# Patient Record
Sex: Female | Born: 1985 | Race: Black or African American | Hispanic: No | Marital: Single | Smoking: Never smoker
Health system: Southern US, Community
[De-identification: ages and names within clinical notes are randomized; demographics above are authoritative.]

## PROBLEM LIST (undated history)

## (undated) DIAGNOSIS — E119 Type 2 diabetes mellitus without complications: Secondary | ICD-10-CM

## (undated) DIAGNOSIS — E049 Nontoxic goiter, unspecified: Secondary | ICD-10-CM

## (undated) DIAGNOSIS — I10 Essential (primary) hypertension: Secondary | ICD-10-CM

## (undated) HISTORY — DX: Type 2 diabetes mellitus without complications: E11.9

## (undated) HISTORY — PX: FRACTURE SURGERY: SHX138

## (undated) HISTORY — DX: Essential (primary) hypertension: I10

## (undated) HISTORY — DX: Nontoxic goiter, unspecified: E04.9

---
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
## 2005-02-10 ENCOUNTER — Emergency Department: Payer: Self-pay | Admitting: Unknown Physician Specialty

## 2008-06-11 ENCOUNTER — Ambulatory Visit: Payer: Self-pay | Admitting: Obstetrics and Gynecology

## 2008-06-12 ENCOUNTER — Inpatient Hospital Stay: Payer: Self-pay | Admitting: Obstetrics and Gynecology

## 2012-12-15 ENCOUNTER — Ambulatory Visit: Payer: Self-pay | Admitting: Physician Assistant

## 2012-12-15 LAB — PREGNANCY, URINE: Pregnancy Test, Urine: NEGATIVE m[IU]/mL

## 2012-12-21 ENCOUNTER — Ambulatory Visit: Payer: Self-pay | Admitting: Unknown Physician Specialty

## 2016-11-14 ENCOUNTER — Emergency Department: Payer: Self-pay

## 2016-11-14 ENCOUNTER — Emergency Department
Admission: EM | Admit: 2016-11-14 | Discharge: 2016-11-14 | Disposition: A | Discharge status: Home / Self Care | Payer: Self-pay | Attending: Emergency Medicine | Admitting: Emergency Medicine

## 2016-11-14 DIAGNOSIS — E1165 Type 2 diabetes mellitus with hyperglycemia: Secondary | ICD-10-CM

## 2016-11-14 DIAGNOSIS — I10 Essential (primary) hypertension: Secondary | ICD-10-CM

## 2016-11-14 DIAGNOSIS — Z5181 Encounter for therapeutic drug level monitoring: Secondary | ICD-10-CM | POA: Insufficient documentation

## 2016-11-14 DIAGNOSIS — R739 Hyperglycemia, unspecified: Secondary | ICD-10-CM

## 2016-11-14 DIAGNOSIS — E119 Type 2 diabetes mellitus without complications: Secondary | ICD-10-CM

## 2016-11-14 DIAGNOSIS — E876 Hypokalemia: Secondary | ICD-10-CM

## 2016-11-14 LAB — COMPREHENSIVE METABOLIC PANEL
ALT: 12 U/L — ABNORMAL LOW (ref 14–54)
AST: 20 U/L (ref 15–41)
Albumin: 3.9 g/dL (ref 3.5–5.0)
Alkaline Phosphatase: 127 U/L — ABNORMAL HIGH (ref 38–126)
Anion gap: 12 (ref 5–15)
BUN: 7 mg/dL (ref 6–20)
CO2: 23 mmol/L (ref 22–32)
Calcium: 8.8 mg/dL — ABNORMAL LOW (ref 8.9–10.3)
Chloride: 97 mmol/L — ABNORMAL LOW (ref 101–111)
Creatinine, Ser: 0.53 mg/dL (ref 0.44–1.00)
GFR calc Af Amer: >60 mL/min (ref >60)
GFR calc non Af Amer: >60 mL/min (ref >60)
Glucose, Bld: 381 mg/dL — ABNORMAL HIGH (ref 65–99)
POTASSIUM: 3.1 mmol/L — AB (ref 3.5–5.1)
Sodium: 132 mmol/L — ABNORMAL LOW (ref 135–145)
TOTAL PROTEIN: 7.9 g/dL (ref 6.5–8.1)
Total Bilirubin: 0.5 mg/dL (ref 0.3–1.2)

## 2016-11-14 LAB — CBC
HEMATOCRIT: 39.3 % (ref 35.0–47.0)
Hemoglobin: 13.4 g/dL (ref 12.0–16.0)
MCH: 29.5 pg (ref 26.0–34.0)
MCHC: 34.2 g/dL (ref 32.0–36.0)
MCV: 86.2 fL (ref 80.0–100.0)
Platelets: 330 10*3/uL (ref 150–440)
RBC: 4.56 MIL/uL (ref 3.80–5.20)
RDW: 13.1 % (ref 11.5–14.5)
WBC: 9.8 10*3/uL (ref 3.6–11.0)

## 2016-11-14 LAB — URINE DRUG SCREEN, QUALITATIVE (ARMC ONLY)
AMPHETAMINES, UR SCREEN: NOT DETECTED
Barbiturates, Ur Screen: NOT DETECTED
Benzodiazepine, Ur Scrn: NOT DETECTED
COCAINE METABOLITE, UR ~~LOC~~: NOT DETECTED
Cannabinoid 50 Ng, Ur ~~LOC~~: NOT DETECTED
MDMA (Ecstasy)Ur Screen: NOT DETECTED
Methadone Scn, Ur: NOT DETECTED
OPIATE, UR SCREEN: NOT DETECTED
PHENCYCLIDINE (PCP) UR S: NOT DETECTED
Tricyclic, Ur Screen: NOT DETECTED

## 2016-11-14 LAB — URINALYSIS, COMPLETE (UACMP) WITH MICROSCOPIC
BACTERIA UA: NONE SEEN
Bilirubin Urine: NEGATIVE
Glucose, UA: >=500 mg/dL — AB
Ketones, ur: 20 mg/dL — AB
Leukocytes, UA: NEGATIVE
Nitrite: NEGATIVE
PROTEIN: NEGATIVE mg/dL
Specific Gravity, Urine: 1.016 (ref 1.005–1.030)
pH: 8 (ref 5.0–8.0)

## 2016-11-14 LAB — INFLUENZA PANEL BY PCR (TYPE A & B)
INFLAPCR: NEGATIVE
Influenza B By PCR: NEGATIVE

## 2016-11-14 LAB — TSH: TSH: 2.472 u[IU]/mL (ref 0.350–4.500)

## 2016-11-14 LAB — GLUCOSE, CAPILLARY
GLUCOSE-CAPILLARY: 307 mg/dL — AB (ref 65–99)
Glucose-Capillary: 272 mg/dL — ABNORMAL HIGH (ref 65–99)

## 2016-11-14 LAB — T4, FREE: FREE T4: 0.96 ng/dL (ref 0.61–1.12)

## 2016-11-14 LAB — CK: Total CK: 102 U/L (ref 38–234)

## 2016-11-14 LAB — TROPONIN I: Troponin I: <0.03 ng/mL (ref <0.03)

## 2016-11-14 MED ORDER — INSULIN ASPART 100 UNIT/ML ~~LOC~~ SOLN: 0.0000 [IU] | Freq: Three times a day (TID) | SUBCUTANEOUS | Status: DC

## 2016-11-14 MED ORDER — IOPAMIDOL (ISOVUE-370) INJECTION 76%
75.0000 mL | Freq: Once | INTRAVENOUS | Status: AC | PRN
  Administered 2016-11-14: 75 mL via INTRAVENOUS

## 2016-11-14 MED ORDER — SODIUM CHLORIDE 0.9 % IV BOLUS (SEPSIS)
1000.0000 mL | Freq: Once | INTRAVENOUS | Status: AC
  Administered 2016-11-14: 1000 mL via INTRAVENOUS

## 2016-11-14 MED ORDER — HYDRALAZINE HCL 20 MG/ML IJ SOLN: 10.0000 mg | Freq: Once | INTRAMUSCULAR | Status: DC

## 2016-11-14 MED ORDER — AMLODIPINE BESYLATE 5 MG PO TABS
10.0000 mg | ORAL_TABLET | Freq: Once | ORAL | Status: AC
  Administered 2016-11-14: 10 mg via ORAL
  Filled 2016-11-14: qty 2

## 2016-11-14 MED ORDER — AMLODIPINE BESYLATE 10 MG PO TABS: 10.0000 mg | ORAL_TABLET | Freq: Every day | ORAL | 0 refills | Status: DC

## 2016-11-14 MED ORDER — METFORMIN HCL 500 MG PO TABS: 500.0000 mg | ORAL_TABLET | Freq: Two times a day (BID) | ORAL | Status: DC

## 2016-11-14 MED ORDER — HYDRALAZINE HCL 20 MG/ML IJ SOLN: 10.0000 mg | INTRAMUSCULAR | Status: DC | PRN

## 2016-11-14 MED ORDER — POTASSIUM CHLORIDE CRYS ER 20 MEQ PO TBCR
40.0000 meq | EXTENDED_RELEASE_TABLET | Freq: Once | ORAL | Status: AC
Start: 2016-11-14 — End: 2016-11-14
  Administered 2016-11-14: 40 meq via ORAL
  Filled 2016-11-14: qty 2

## 2016-11-14 MED ORDER — METFORMIN HCL 500 MG PO TABS
500.0000 mg | ORAL_TABLET | Freq: Once | ORAL | Status: AC
  Administered 2016-11-14: 500 mg via ORAL
  Filled 2016-11-14: qty 1

## 2016-11-14 MED ORDER — HYDROCHLOROTHIAZIDE 25 MG PO TABS
25.0000 mg | ORAL_TABLET | Freq: Every day | ORAL | Status: DC
  Administered 2016-11-14: 25 mg via ORAL
  Filled 2016-11-14: qty 1

## 2016-11-14 MED ORDER — METFORMIN HCL 500 MG PO TABS: 500.0000 mg | ORAL_TABLET | Freq: Two times a day (BID) | ORAL | 0 refills | Status: DC

## 2016-11-14 MED ORDER — INSULIN ASPART 100 UNIT/ML ~~LOC~~ SOLN
0.0000 [IU] | Freq: Every day | SUBCUTANEOUS | Status: DC
Start: 2016-11-14 — End: 2016-11-14

## 2016-11-14 NOTE — ED Notes (Signed)
Patient states she was getting ready for her nightly routine and states her finger started tingling, feet were tingling, heart started racing and she felt shortness of breath with that. Also states that she started to feel shaky and knew something wasn't right so called 911. States nausea without vomiting. Denies diarrhea. Frequent urination without back pain. Has been very thirsty lately. Family history of diabetes.

## 2016-11-14 NOTE — ED Provider Notes (Signed)
[facility] Emergency Department Provider Note   ____________________________________________   First MD Initiated Contact with Patient 11/14/16 [phone]     (approximate)  I have reviewed the triage vital signs and the nursing notes.   HISTORY  Chief Complaint Generalized Body Aches    HPI Deanna Zimmerman is a 31 y.o. female brought to the ED via EMS from home with a chief complain of generalized malaise. Patient reports she has not been feeling well for the past 2 days with generalized body aches, nausea, fingertips and feet tingling. As she was getting ready for bed, she felt her heart begin needs a race associated with shortness of breath. Reports frequent urination without back pain and states she has been very thirsty lately. Denies fever, chills, chest pain, abdominal pain, vomiting, diarrhea. Denies recent travel or trauma. Nothing makes her symptoms better or worse.   Past medical history None  There are no active problems to display for this patient.   History reviewed. No pertinent surgical history.  Prior to Admission medications   Not on File    Allergies Patient has no known allergies.  Family History Diabetes  Social History Social History  Substance Use Topics  . Smoking status: Not on file  . Smokeless tobacco: Not on file  . Alcohol use Not on file  Denies illicit drug use.  Review of Systems  Constitutional: Positive for generalized malaise. No fever/chills. Eyes: No visual changes. ENT: No sore throat. Cardiovascular: Denies chest pain. Respiratory: Positive for shortness of breath. Gastrointestinal: No abdominal pain.  Positive for nausea, no vomiting.  No diarrhea.  No constipation. Genitourinary: Positive for polyuria. Negative for dysuria. Musculoskeletal: Negative for back pain. Skin: Negative for rash. Neurological: Negative for headaches, focal weakness or numbness.  10-point ROS otherwise  negative.  ____________________________________________   PHYSICAL EXAM:  VITAL SIGNS: ED Triage Vitals  Enc Vitals Group     BP 11/14/16 0321 (!) 161/117     Pulse Rate 11/14/16 0321 (!) 126     Resp 11/14/16 0321 18     Temp 11/14/16 0321 98.5 F (36.9 C)     Temp Source 11/14/16 0321 Oral     SpO2 11/14/16 0321 100 %     Weight 11/14/16 0322 140 lb (63.5 kg)     Height 11/14/16 0322 5\' 3"  (1.6 m)     Head Circumference --      Peak Flow --      Pain Score 11/14/16 0323 0     Pain Loc --      Pain Edu? --      Excl. in GC? --     Constitutional: Alert and oriented. Well appearing and in mild acute distress. Eyes: Conjunctivae are normal. PERRL. EOMI. Head: Atraumatic. Nose: No congestion/rhinnorhea. Mouth/Throat: Mucous membranes are moist.  Oropharynx non-erythematous. Neck: No stridor.  No carotid bruits. Cardiovascular: Tachycardic rate, regular rhythm. Grossly normal heart sounds.  Good peripheral circulation. Respiratory: Normal respiratory effort.  No retractions. Lungs CTAB. Gastrointestinal: Soft and nontender. No distention. No abdominal bruits. No CVA tenderness. Musculoskeletal: No lower extremity tenderness nor edema.  No joint effusions. Neurologic:  Normal speech and language. No gross focal neurologic deficits are appreciated.  Skin:  Skin is warm, dry and intact. No rash noted. Psychiatric: Mood and affect are normal. Speech and behavior are normal.  ____________________________________________   LABS (all labs ordered are listed, but only abnormal results are displayed)  Labs Reviewed  COMPREHENSIVE METABOLIC PANEL -  Abnormal; Notable for the following:       Result Value   Sodium 132 (*)    Potassium 3.1 (*)    Chloride 97 (*)    Glucose, Bld 381 (*)    Calcium 8.8 (*)    ALT 12 (*)    Alkaline Phosphatase 127 (*)    All other components within normal limits  URINALYSIS, COMPLETE (UACMP) WITH MICROSCOPIC - Abnormal; Notable for the  following:    Color, Urine COLORLESS (*)    APPearance CLEAR (*)    Glucose, UA >=500 (*)    Hgb urine dipstick LARGE (*)    Ketones, ur 20 (*)    Squamous Epithelial / LPF 0-5 (*)    All other components within normal limits  CBC  TROPONIN I  CK  URINE DRUG SCREEN, QUALITATIVE ([facility] ONLY)  INFLUENZA PANEL BY PCR (TYPE A & B)  TSH  T4, FREE  POC URINE PREG, ED  CBG MONITORING, ED   ____________________________________________  EKG  ED ECG REPORT I, Itzia Cunliffe J, the attending physician, personally viewed and interpreted this ECG.   Date: 11/14/2016  EKG Time: 0329  Rate: 123  Rhythm: sinus bradycardia  Axis: Normal  Intervals:none  ST&T Change: Nonspecific  ____________________________________________  RADIOLOGY  Chest xray (viewed by me, interpreted per Dr. Sterling Big): No active disease.  CTA chest interpreted per Dr. Gwenyth Bender: No acute intrathoracic pathology. No CT evidence of pulmonary  embolism.   __________________________________________   PROCEDURES  Procedure(s) performed: None  Procedures  Critical Care performed:   CRITICAL CARE Performed by: Irean Hong   Total critical care time: 30 minutes  Critical care time was exclusive of separately billable procedures and treating other patients.  Critical care was necessary to treat or prevent imminent or life-threatening deterioration.  Critical care was time spent personally by me on the following activities: development of treatment plan with patient and/or surrogate as well as nursing, discussions with consultants, evaluation of patient's response to treatment, examination of patient, obtaining history from patient or surrogate, ordering and performing treatments and interventions, ordering and review of laboratory studies, ordering and review of radiographic studies, pulse oximetry and re-evaluation of patient's condition.  ____________________________________________   INITIAL IMPRESSION /  ASSESSMENT AND PLAN / ED COURSE  Pertinent labs & imaging results that were available during my care of the patient were reviewed by me and considered in my medical decision making (see chart for details).  31 year old previously healthy female who presents with generalized malaise, shortness of breath, polyuria. She is tachycardic and hypertensive with F SPS greater than 500 per EMS. Will initiate IV fluid resuscitation, screening lab work, CT angiogram of chest to evaluate for PE, dissection given hypertension and tachycardia; anticipate hospital admission.  Clinical Course as of Nov 15 643  Mon Nov 14, 2016  1914 Patient currently in CT scan. Tachycardia and elevated blood pressure improving; IV hydralazine ordered for further BP improvement.  [JS]  [phone] Discussed with hospitalist to evaluate patient in the emergency department for admission. Will add TSH and free T4 to labs already drawn. Heart rate down to 105 with IV fluids. Updated patient and her mother of CT imaging results.  [JS]    Clinical Course User Index [JS] Irean Hong, MD     ____________________________________________   FINAL CLINICAL IMPRESSION(S) / ED DIAGNOSES  Final diagnoses:  Hyperglycemia  Type 2 diabetes mellitus with hyperglycemia, without long-term current use of insulin (HCC)  Essential hypertension  Hypokalemia  NEW MEDICATIONS STARTED DURING THIS VISIT:  New Prescriptions   No medications on file     Note:  This document was prepared using Dragon voice recognition software and may include unintentional dictation errors.    Irean HongJade J Dollie Bressi, MD 11/14/16 [phone]

## 2016-11-14 NOTE — ED Provider Notes (Signed)
Asked by Dr. Dolores FrameSung to follow up with patient after IVF. Patient's vitals have normalized and she feels well. She understands the need for f/u and the need for compliance with medications   Jene Everyobert Caprisha Bridgett, MD 11/14/16 1021

## 2016-11-14 NOTE — Discharge Instructions (Signed)
1. Start metformin 500 mg twice daily to control your blood sugars. 2. Start Norvasc 10 mg daily to control your blood pressure. 3. Return to the ER for worsening symptoms, persistent vomiting, difficult breathing or other concerns.

## 2016-11-14 NOTE — ED Notes (Signed)
Patient denies pain and is resting comfortably.  

## 2016-11-14 NOTE — Care Management Note (Signed)
Case Management Note  Patient Details  Name: Deanna Zimmerman MRN: [id number] Date of Birth: 12/05/1985  Subjective/Objective:       Spoke to patient at bedside per Dr Dolores FrameSung. [facility] as the MD is anticipating pt. To be discharged. At this point the patient is tearful saying, "it's just so much to deal With." I have agreed with her and assured her we are giving her resource information. I have explained how they both work and where they are located . Patient has no further questions at this time.            Action/Plan:   Expected Discharge Date:                  Expected Discharge Plan:     In-House Referral:     Discharge planning Services  CM Consult  Post Acute Care Choice:    Choice offered to:     DME Arranged:    DME Agency:     HH Arranged:    HH Agency:     Status of Service:     If discussed at [employer] Length of Stay Meetings, dates discussed:    Additional Comments:  Berna BueCheryl Evian Salguero, RN 11/14/2016, 9:18 AM

## 2016-11-14 NOTE — ED Notes (Signed)
Pt discharged home after verbalizing understanding of discharge instructions; nad noted. 

## 2016-11-14 NOTE — ED Triage Notes (Signed)
Pt states that she has been feeling unwell x2 days. Pt states that her arms and legs hurt. Per [facility] pt is ST in 120's, CBG 402 with no hx of diabetes, and BP 160/130 which came down from their original pressure of 206/136.

## 2016-11-15 LAB — HEMOGLOBIN A1C
HEMOGLOBIN A1C: 13.3 % — AB (ref 4.8–5.6)
MEAN PLASMA GLUCOSE: 335 mg/dL

## 2016-11-30 ENCOUNTER — Ambulatory Visit: Payer: Self-pay | Admitting: Internal Medicine

## 2016-12-14 ENCOUNTER — Ambulatory Visit: Payer: Self-pay | Admitting: Internal Medicine

## 2016-12-14 ENCOUNTER — Encounter: Payer: Self-pay | Admitting: Internal Medicine

## 2016-12-14 VITALS — BP 142/95 | HR 116 | Temp 97.8°F | Wt 134.0 lb

## 2016-12-14 DIAGNOSIS — E119 Type 2 diabetes mellitus without complications: Secondary | ICD-10-CM

## 2016-12-14 LAB — GLUCOSE, POCT (MANUAL RESULT ENTRY): POC Glucose: 338 mg/dl — AB (ref 70–99)

## 2016-12-14 MED ORDER — AMLODIPINE BESYLATE 10 MG PO TABS
10.0000 mg | ORAL_TABLET | Freq: Every day | ORAL | 3 refills | Status: DC
Start: 2016-12-14 — End: 2016-12-15

## 2016-12-14 MED ORDER — METFORMIN HCL 500 MG PO TABS: 500.0000 mg | ORAL_TABLET | Freq: Two times a day (BID) | ORAL | 3 refills | Status: DC

## 2016-12-14 NOTE — Progress Notes (Signed)
   Subjective:    Patient ID: Deanna Zimmerman, female    DOB: 03/21/1986, 31 y.o.   MRN: [id number]  HPI   New pt visit.  Pt has new onset of diabetes and hypertension. Dx in the ED a month ago.  Pt reports understanding of how to self-check sugars.  BS is 338. Pt reports eating breakfast at 8:30am.   Patient Active Problem List   Diagnosis Date Noted  . Diabetes mellitus, new onset (HCC) 11/14/2016   Allergies as of 12/14/2016   No Known Allergies     Medication List       Accurate as of 12/14/16  9:59 AM. Always use your most recent med list.          amLODipine 10 MG tablet Commonly known as:  NORVASC Take 1 tablet (10 mg total) by mouth daily.   metFORMIN 500 MG tablet Commonly known as:  GLUCOPHAGE Take 1 tablet (500 mg total) by mouth 2 (two) times daily with a meal.        Review of Systems     Objective:   Physical Exam  Constitutional: She is oriented to person, place, and time.  Cardiovascular: Normal rate, regular rhythm and normal heart sounds.   Pulmonary/Chest: Effort normal and breath sounds normal.  Neurological: She is alert and oriented to person, place, and time.    BP (!) 142/95   Pulse (!) 116   Temp 97.8 F (36.6 C) (Oral)   Wt 134 lb (60.8 kg)   LMP 11/10/2016 Comment: neg preg test  BMI 23.74 kg/m        Assessment & Plan:   Labs today: MetC, CBC, C-peptide F/u in 2-3 weeks. No labs  Referral to endocrinology clinic  Needs a meter with instructions

## 2016-12-14 NOTE — Patient Instructions (Signed)
Labs today Follow-up in 2-3 weeks.  Referral to endocrinology clinic. Use meter to check blood sugar.

## 2016-12-15 ENCOUNTER — Encounter: Payer: Self-pay | Admitting: Internal Medicine

## 2016-12-15 ENCOUNTER — Telehealth: Payer: Self-pay

## 2016-12-15 DIAGNOSIS — E119 Type 2 diabetes mellitus without complications: Secondary | ICD-10-CM

## 2016-12-15 LAB — CBC
HEMOGLOBIN: 12 g/dL (ref 11.1–15.9)
Hematocrit: 37.5 % (ref 34.0–46.6)
MCH: 27.8 pg (ref 26.6–33.0)
MCHC: 32 g/dL (ref 31.5–35.7)
MCV: 87 fL (ref 79–97)
Platelets: 384 10*3/uL — ABNORMAL HIGH (ref 150–379)
RBC: 4.32 x10E6/uL (ref 3.77–5.28)
RDW: 12.9 % (ref 12.3–15.4)
WBC: 6.1 10*3/uL (ref 3.4–10.8)

## 2016-12-15 LAB — COMPREHENSIVE METABOLIC PANEL
A/G RATIO: 1.4 (ref 1.2–2.2)
ALT: 8 IU/L (ref 0–32)
AST: 10 IU/L (ref 0–40)
Albumin: 4.1 g/dL (ref 3.5–5.5)
Alkaline Phosphatase: 108 IU/L (ref 39–117)
BILIRUBIN TOTAL: 0.2 mg/dL (ref 0.0–1.2)
BUN/Creatinine Ratio: 16 (ref 9–23)
BUN: 10 mg/dL (ref 6–20)
CHLORIDE: 93 mmol/L — AB (ref 96–106)
CO2: 23 mmol/L (ref 18–29)
Calcium: 9.5 mg/dL (ref 8.7–10.2)
Creatinine, Ser: 0.61 mg/dL (ref 0.57–1.00)
GFR calc Af Amer: 140 (ref >59)
GFR calc non Af Amer: 121 (ref >59)
Globulin, Total: 3 (ref 1.5–4.5)
Glucose: 375 mg/dL — ABNORMAL HIGH (ref 65–99)
POTASSIUM: 4.3 mmol/L (ref 3.5–5.2)
Sodium: 134 mmol/L (ref 134–144)
Total Protein: 7.1 g/dL (ref 6.0–8.5)

## 2016-12-15 LAB — C-PEPTIDE: C PEPTIDE: 2.7 ng/mL (ref 1.1–4.4)

## 2016-12-15 MED ORDER — METFORMIN HCL 500 MG PO TABS: 500.0000 mg | ORAL_TABLET | Freq: Two times a day (BID) | ORAL | 3 refills | Status: DC

## 2016-12-15 MED ORDER — AMLODIPINE BESYLATE 10 MG PO TABS: 10.0000 mg | ORAL_TABLET | Freq: Every day | ORAL | 3 refills | Status: DC

## 2016-12-15 NOTE — Telephone Encounter (Signed)
-----   Message from Vicie MuttersChristan T Holt, [facility] sent at 12/14/2016  4:12 PM EST ----- Regarding: Please send RXs 2 rxs written today by Dr. Candelaria Stagershaplin, [facility]. Please re-send.  Thank you, Denice Paradisehristan Holt, PharmD, RPh [facility] [phone]

## 2016-12-21 ENCOUNTER — Encounter: Payer: Self-pay | Admitting: Internal Medicine

## 2016-12-29 ENCOUNTER — Ambulatory Visit: Payer: Self-pay

## 2017-01-03 ENCOUNTER — Ambulatory Visit: Payer: Self-pay | Admitting: Adult Health Nurse Practitioner

## 2017-01-03 VITALS — BP 136/93 | HR 106 | Temp 98.6°F | Wt 134.7 lb

## 2017-01-03 DIAGNOSIS — E119 Type 2 diabetes mellitus without complications: Secondary | ICD-10-CM

## 2017-01-03 DIAGNOSIS — I10 Essential (primary) hypertension: Secondary | ICD-10-CM | POA: Insufficient documentation

## 2017-01-03 LAB — GLUCOSE, POCT (MANUAL RESULT ENTRY): POC GLUCOSE: 159 mg/dL — AB (ref 70–99)

## 2017-01-03 MED ORDER — HYDROCHLOROTHIAZIDE 12.5 MG PO TABS: 12.5000 mg | ORAL_TABLET | Freq: Every day | ORAL | 2 refills | Status: DC

## 2017-01-03 MED ORDER — METFORMIN HCL 1000 MG PO TABS: 1000.0000 mg | ORAL_TABLET | Freq: Two times a day (BID) | ORAL | 1 refills | Status: DC

## 2017-01-03 NOTE — Progress Notes (Signed)
  Patient: Deanna ProvidenceBrittney L Cottrill Female    DOB: February 13, 1986   31 y.o.   MRN: [id number] Visit Date: 01/03/2017  Today's Provider: Jacelyn Pieah Doles-Johnson, NP   Chief Complaint  Patient presents with  . Follow-up    has questions about optimal glucose ranges   Subjective:    HPI     DM:  Newly diagnosed.  Given meter at last visit.  CBG's average 180-350 fasting.  A1C on 1.22 was 13.3.  Started on Metformin 500mg  BID- taking as directed. Stomach upset has improved.    HTN:  Taking medications as directed.  BP at home 120s/80-90.   No Known Allergies Previous Medications   AMLODIPINE (NORVASC) 10 MG TABLET    Take 1 tablet (10 mg total) by mouth daily.   METFORMIN (GLUCOPHAGE) 500 MG TABLET    Take 1 tablet (500 mg total) by mouth 2 (two) times daily with a meal.    Review of Systems  All other systems reviewed and are negative.   Social History  Substance Use Topics  . Smoking status: Never Smoker  . Smokeless tobacco: Never Used  . Alcohol use Not on file   Objective:   BP (!) 136/93   Pulse (!) 106   Temp 98.6 F (37 C)   Wt 134 lb 11.2 oz (61.1 kg)   BMI 23.86 kg/m   Physical Exam  Constitutional: She is oriented to person, place, and time. She appears well-developed and well-nourished.  Cardiovascular: Regular rhythm and normal heart sounds.   Pulmonary/Chest: Effort normal and breath sounds normal.  Abdominal: Soft. Bowel sounds are normal.  Neurological: She is alert and oriented to person, place, and time.  Vitals reviewed.       Assessment & Plan:          DM:  Goal A1C <7.  Discussed target fasting CBG of 100-150 Encourage diabetic diet and exercise.  Increase Metformin to 1000mg  BID.  Microalbumin urine ordered today.    HTN:  Not controlled.  Goal BP <130/80. Adding HCTZ 12.5 daily along with Norvasc 10mg  daily.  Encourage low salt diet and exercise.     Labs reviewed.  FU in 4 weeks for A1C/CMET and BP check.     Jacelyn Pieah Doles-Johnson,  NP   [facility]

## 2017-01-04 ENCOUNTER — Ambulatory Visit: Payer: Self-pay | Admitting: Internal Medicine

## 2017-01-04 LAB — MICROALBUMIN / CREATININE URINE RATIO
CREATININE, UR: 32 mg/dL
MICROALBUM., U, RANDOM: 6.2 ug/mL
Microalb/Creat Ratio: 19.4 mg/g creat (ref 0.0–30.0)

## 2017-01-11 ENCOUNTER — Ambulatory Visit: Payer: Self-pay | Admitting: Pharmacy Technician

## 2017-01-11 DIAGNOSIS — Z79899 Other long term (current) drug therapy: Secondary | ICD-10-CM

## 2017-01-13 NOTE — Progress Notes (Signed)
Met with patient completed financial assistance application for [place] due to recent hospital visit.  Patient agreed to be responsible for gathering financial information and forwarding to appropriate department in [facility].    Completed [facility] application and contract.  Patient agreed to all terms of the [facility] contract.  Patient approved to receive medication assistance through 2018, as long as patient's household income does not exceed 250% FPL or pt does not obtain prescription coverage.   Provided patient with community resource material based on her particular needs.    [facility]

## 2017-02-02 ENCOUNTER — Other Ambulatory Visit: Payer: Self-pay

## 2017-02-02 DIAGNOSIS — E119 Type 2 diabetes mellitus without complications: Secondary | ICD-10-CM

## 2017-02-03 LAB — COMPREHENSIVE METABOLIC PANEL
A/G RATIO: 1.3 (ref 1.2–2.2)
ALT: 11 IU/L (ref 0–32)
AST: 15 IU/L (ref 0–40)
Albumin: 4 g/dL (ref 3.5–5.5)
Alkaline Phosphatase: 91 IU/L (ref 39–117)
BUN/Creatinine Ratio: 23 (ref 9–23)
BUN: 14 mg/dL (ref 6–20)
Bilirubin Total: <0.2 mg/dL (ref 0.0–1.2)
CALCIUM: 9.4 mg/dL (ref 8.7–10.2)
CO2: 27 mmol/L (ref 18–29)
CREATININE: 0.62 mg/dL (ref 0.57–1.00)
Chloride: 94 mmol/L — ABNORMAL LOW (ref 96–106)
GFR, EST AFRICAN AMERICAN: 139 mL/min/{1.73_m2} (ref >59)
GFR, EST NON AFRICAN AMERICAN: 121 mL/min/{1.73_m2} (ref >59)
Globulin, Total: 3.1 g/dL (ref 1.5–4.5)
Glucose: 208 mg/dL — ABNORMAL HIGH (ref 65–99)
Potassium: 4.3 mmol/L (ref 3.5–5.2)
Sodium: 136 mmol/L (ref 134–144)
TOTAL PROTEIN: 7.1 g/dL (ref 6.0–8.5)

## 2017-02-03 LAB — HEMOGLOBIN A1C
Est. average glucose Bld gHb Est-mCnc: 249 mg/dL
Hgb A1c MFr Bld: 10.3 % — ABNORMAL HIGH (ref 4.8–5.6)

## 2017-02-07 ENCOUNTER — Ambulatory Visit: Payer: Self-pay | Admitting: Endocrinology

## 2017-02-07 VITALS — BP 129/83 | HR 110 | Wt 133.7 lb

## 2017-02-07 DIAGNOSIS — E119 Type 2 diabetes mellitus without complications: Secondary | ICD-10-CM

## 2017-02-07 DIAGNOSIS — E049 Nontoxic goiter, unspecified: Secondary | ICD-10-CM | POA: Insufficient documentation

## 2017-02-07 HISTORY — DX: Nontoxic goiter, unspecified: E04.9

## 2017-02-07 LAB — GLUCOSE, POCT (MANUAL RESULT ENTRY): POC Glucose: 143 mg/dl — AB (ref 70–99)

## 2017-02-07 MED ORDER — GLIMEPIRIDE 2 MG PO TABS: 1.0000 mg | ORAL_TABLET | Freq: Every day | ORAL | 4 refills | Status: DC

## 2017-02-07 MED ORDER — HYDROCHLOROTHIAZIDE 12.5 MG PO TABS: 12.5000 mg | ORAL_TABLET | Freq: Every day | ORAL | 2 refills | Status: DC

## 2017-02-07 MED ORDER — METFORMIN HCL 1000 MG PO TABS: 1000.0000 mg | ORAL_TABLET | Freq: Two times a day (BID) | ORAL | 1 refills | Status: DC

## 2017-02-07 NOTE — Patient Instructions (Signed)
Take metformin 1000 mg twice a day Add glimepiride 2 mg tablets, starting with a 1/2 tablet a day.  If after a week or two you sugars are not usually less than 130, go to 1 whole tablet a day.  GLimepirde can cause hypoglycemia.  Call if you have troubles.     Hypoglycemia Hypoglycemia occurs when the level of sugar (glucose) in the blood is too low. Glucose is a type of sugar that provides the body's main source of energy. Certain hormones (insulin and glucagon) control the level of glucose in the blood. Insulin lowers blood glucose, and glucagon increases blood glucose. Hypoglycemia can result from having too much insulin in the bloodstream, or from not eating enough food that contains glucose. Hypoglycemia can happen in people who do or do not have diabetes. It can develop quickly, and it can be a medical emergency. What are the causes? Hypoglycemia occurs most often in people who have diabetes. If you have diabetes, hypoglycemia may be caused by:  Diabetes medicine.  Not eating enough, or not eating often enough.  Increased physical activity.  Drinking alcohol, especially when you have not eaten recently. If you do not have diabetes, hypoglycemia may be caused by:  A tumor in the pancreas. The pancreas is the organ that makes insulin.  Not eating enough, or not eating for long periods at a time (fasting).  Severe infection or illness that affects the liver, heart, or kidneys.  Certain medicines. You may also have reactive hypoglycemia. This condition causes hypoglycemia within 4 hours of eating a meal. This may occur after having stomach surgery. Sometimes, the cause of reactive hypoglycemia is not known. What increases the risk? Hypoglycemia is more likely to develop in:  People who have diabetes and take medicines to lower blood glucose.  People who abuse alcohol.  People who have a severe illness. What are the signs or symptoms? Hypoglycemia may not cause any symptoms. If  you have symptoms, they may include:  Hunger.  Anxiety.  Sweating and feeling clammy.  Confusion.  Dizziness or feeling light-headed.  Sleepiness.  Nausea.  Increased heart rate.  Headache.  Blurry vision.  Seizure.  Nightmares.  Tingling or numbness around the mouth, lips, or tongue.  A change in speech.  Decreased ability to concentrate.  A change in coordination.  Restless sleep.  Tremors or shakes.  Fainting.  Irritability. How is this diagnosed? Hypoglycemia is diagnosed with a blood test to measure your blood glucose level. This blood test is done while you are having symptoms. Your health care provider may also do a physical exam and review your medical history. If you do not have diabetes, other tests may be done to find the cause of your hypoglycemia. How is this treated? This condition can often be treated by immediately eating or drinking something that contains glucose, such as:  3-4 sugar tablets (glucose pills).  Glucose gel, 15-gram tube.  Fruit juice, 4 oz (120 mL).  Regular soda (not diet soda), 4 oz (120 mL).  Low-fat milk, 4 oz (120 mL).  Several pieces of hard candy.  Sugar or honey, 1 Tbsp. Treating Hypoglycemia If You Have Diabetes   If you are alert and able to swallow safely, follow the 15:15 rule:  Take 15 grams of a rapid-acting carbohydrate. Rapid-acting options include:  1 tube of glucose gel.  3 glucose pills.  6-8 pieces of hard candy.  4 oz (120 mL) of fruit juice.  4 oz (120 ml) of regular (  not diet) soda.  Check your blood glucose 15 minutes after you take the carbohydrate.  If the repeat blood glucose level is still at or below 70 mg/dL (3.9 mmol/L), take 15 grams of a carbohydrate again.  If your blood glucose level does not increase above 70 mg/dL (3.9 mmol/L) after 3 tries, seek emergency medical care.  After your blood glucose level returns to normal, eat a meal or a snack within 1 hour. Treating  Severe Hypoglycemia  Severe hypoglycemia is when your blood glucose level is at or below 54 mg/dL (3 mmol/L). Severe hypoglycemia is an emergency. Do not wait to see if the symptoms will go away. Get medical help right away. Call your local emergency services (911 in the [place] Do not drive yourself to the hospital. If you have severe hypoglycemia and you cannot eat or drink, you may need an injection of glucagon. A family member or close friend should learn how to check your blood glucose and how to give you a glucagon injection. Ask your health care provider if you need to have an emergency glucagon injection kit available. Severe hypoglycemia may need to be treated in a hospital. The treatment may include getting glucose through an IV tube. You may also need treatment for the cause of your hypoglycemia. Follow these instructions at home: General instructions   Avoid any diets that cause you to not eat enough food. Talk with your health care provider before you start any new diet.  Take over-the-counter and prescription medicines only as told by your health care provider.  Limit alcohol intake to no more than 1 drink per day for nonpregnant women and 2 drinks per day for men. One drink equals 12 oz of beer, 5 oz of wine, or 1 oz of hard liquor.  Keep all follow-up visits as told by your health care provider. This is important. If You Have Diabetes:    Make sure you know the symptoms of hypoglycemia.  Always have a rapid-acting carbohydrate snack with you to treat low blood sugar.  Follow your diabetes management plan, as told by your health care provider. Make sure you:  Take your medicines as directed.  Follow your exercise plan.  Follow your meal plan. Eat on time, and do not skip meals.  Check your blood glucose as often as directed. Make sure to check your blood glucose before and after exercise. If you exercise longer or in a different way than usual, check your blood glucose  more often.  Follow your sick day plan whenever you cannot eat or drink normally. Make this plan in advance with your health care provider.  Share your diabetes management plan with people in your workplace, school, and household.  Check your urine for ketones when you are ill and as told by your health care provider.  Carry a medical alert card or wear medical alert jewelry. If You Have Reactive Hypoglycemia or Low Blood Sugar From Other Causes:   Monitor your blood glucose as told by your health care provider.  Follow instructions from your health care provider about eating or drinking restrictions. Contact a health care provider if:  You have problems keeping your blood glucose in your target range.  You have frequent episodes of hypoglycemia. Get help right away if:  You continue to have hypoglycemia symptoms after eating or drinking something containing glucose.  Your blood glucose is at or below 54 mg/dL (3 mmol/L).  You have a seizure.  You faint. These symptoms may  represent a serious problem that is an emergency. Do not wait to see if the symptoms will go away. Get medical help right away. Call your local emergency services (911 in the [place] Do not drive yourself to the hospital. This information is not intended to replace advice given to you by your health care provider. Make sure you discuss any questions you have with your health care provider. Document Released: 10/10/2005 Document Revised: 03/23/2016 Document Reviewed: 11/13/2015 Elsevier Interactive Patient Education  2017 [employer].

## 2017-02-07 NOTE — Progress Notes (Signed)
Assessment:   Type 2 diabetes- recent diagnosis     Plan:    1.  Rx changes: Add glimeperide to diabetes regimen. Discussed low blood sugar.    3. Follow up: I recommend patient follow-up with me at 3 months.     Subjective:    Deanna Zimmerman is a 31 y.o. female who is seen in follow up forType 2 diabetes from January. She was diagnosed in January after she was hospitalized for hyperglycemia.   She stopped eating friend foods and has been eating more vegetables and water.   Eye exam current (within one year): No.   Current regimen: Takes 1000 MG of metformin in the morning after eating. She was taking 500 mg of metformin and increased 1000 mg of 12/21/16. She is having side effects from the metformin.   Deanna Zimmerman is performing SMBG on average 1 times per day. Range 154-180.  Denies chest pain, shortness of breath, edema, foot lesions or ulcers.  Current exercise: walks 30 min 5 days a week while her daughter plays softball   The patient's history was reviewed and updated as appropriate.  No Known Allergies   Current Outpatient Prescriptions:  .  hydrochlorothiazide (HYDRODIURIL) 12.5 MG tablet, Take 1 tablet (12.5 mg total) by mouth daily., Disp: 30 tablet, Rfl: 2 .  metFORMIN (GLUCOPHAGE) 1000 MG tablet, Take 1 tablet (1,000 mg total) by mouth 2 (two) times daily with a meal., Disp: 180 tablet, Rfl: 1 .  amLODipine (NORVASC) 10 MG tablet, Take 1 tablet (10 mg total) by mouth daily. (Patient not taking: Reported on 02/07/2017), Disp: 90 tablet, Rfl: 3  Social History   Social History  . Marital status: Single    Spouse name: N/A  . Number of children: N/A  . Years of education: N/A   Social History Main Topics  . Smoking status: Never Smoker  . Smokeless tobacco: Never Used  . Alcohol use None  . Drug use: Unknown  . Sexual activity: Not Asked   Other Topics Concern  . None   Social History Narrative  . None    Family History  Problem  Relation Age of Onset  . Diabetes Sister     Review of Systems A 12 point review of systems was negative except for pertinent items noted in the HPI.   Objective:     Wt Readings from Last 3 Encounters:  02/07/17 133 lb 11.2 oz (60.6 kg)  01/03/17 134 lb 11.2 oz (61.1 kg)  12/14/16 134 lb (60.8 kg)   BP 129/83   Pulse (!) 110   Wt 133 lb 11.2 oz (60.6 kg)   LMP 01/23/2017   BMI 23.68 kg/m   General appearance:  alert and appears stated age, in no distress      Eyes:  conjunctivae/corneas clear. EOM's intact. Sclera anicteric. Negative lid lag or proptosis     Neck: no adenopathy, supple, symmetrical, trachea midline  Thyroid:  30 gram goiter  Lung:   Heart:  regular rate and rhythm, S1, S2 normal, no murmur, click, rub or gallop  Abdomen:  bowel sounds normal; negative bruits  Extremities: extremities normal, atraumatic, no cyanosis or edema     Pulses: DP & PT 2+ and symmetric.  Neuro: normal without focal findings, mental status, speech normal, alert and oriented x3, reflexes normal and symmetric and gait normal.   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   Lab Review No components found for: A1C Glucose (mg/dL)  Date Value  40/98/1191 208 (H)  12/14/2016 375 (H)   Glucose, Bld (mg/dL)  Date Value  47/82/9562 381 (H)   CO2 (mmol/L)  Date Value  02/02/2017 27  12/14/2016 23  11/14/2016 23   BUN (mg/dL)  Date Value  13/05/6577 14  12/14/2016 10  11/14/2016 7   Creatinine, Ser (mg/dL)  Date Value  46/96/2952 0.62  12/14/2016 0.61  11/14/2016 0.53   No components found for: LDL,  LDLCALC,  LDLDIRECT Lab Results  Component Value Date   NA 136 02/02/2017   K 4.3 02/02/2017   CL 94 (L) 02/02/2017   CO2 27 02/02/2017   BUN 14 02/02/2017   CREATININE 0.62 02/02/2017   GFRAA 139 02/02/2017   GFRNONAA 121 02/02/2017   CALCIUM 9.4  02/02/2017   ALBUMIN 4.0 02/02/2017     DIABETES MELLITUS RESULTS: Lab Results  Component Value Date   HGBA1C 10.3 (H) 02/02/2017   HGBA1C 13.3 (H) 11/14/2016   Lab Results  Component Value Date   CREATININE 0.62 02/02/2017   No results found for: CHOL No results found for: LDLCALC No components found for: CHOLLLDLDIRECT No components found for: MICROALB/CR Lab Results  Component Value Date   GFRAA 139 02/02/2017   GFRNONAA 121 02/02/2017

## 2017-02-09 ENCOUNTER — Ambulatory Visit: Payer: Self-pay | Admitting: Adult Health Nurse Practitioner

## 2017-02-09 VITALS — BP 124/76 | HR 109 | Temp 98.1°F | Ht 60.0 in | Wt 132.9 lb

## 2017-02-09 DIAGNOSIS — E119 Type 2 diabetes mellitus without complications: Secondary | ICD-10-CM

## 2017-02-09 DIAGNOSIS — I10 Essential (primary) hypertension: Secondary | ICD-10-CM

## 2017-02-09 LAB — POCT CBG (FASTING - GLUCOSE)-MANUAL ENTRY: Glucose Fasting, POC: 161 mg/dL — AB (ref 70–99)

## 2017-02-09 NOTE — Progress Notes (Signed)
  Patient: Deanna Zimmerman Female    DOB: 01-22-1986   31 y.o.   MRN: [id number] Visit Date: 02/09/2017  Today's Provider: Jacelyn Pi, NP   Chief Complaint  Patient presents with  . Follow-up   Subjective:    HPI    HTN:  Taking medications as directed.  Monitoring diet and exercise.  Pt with baseline tachycardia ranging in the low 100s.  She denies palpitations, CP, dyspnea, dizziness, HA.    No Known Allergies Previous Medications   GLIMEPIRIDE (AMARYL) 2 MG TABLET    Take 0.5-1 tablets (1-2 mg total) by mouth daily with breakfast.   HYDROCHLOROTHIAZIDE (HYDRODIURIL) 12.5 MG TABLET    Take 1 tablet (12.5 mg total) by mouth daily.   METFORMIN (GLUCOPHAGE) 1000 MG TABLET    Take 1 tablet (1,000 mg total) by mouth 2 (two) times daily with a meal.    Review of Systems  All other systems reviewed and are negative.   Social History  Substance Use Topics  . Smoking status: Never Smoker  . Smokeless tobacco: Never Used  . Alcohol use Not on file   Objective:   BP 124/76 (BP Location: Left Arm, Patient Position: Sitting, Cuff Size: Normal)   Pulse (!) 109   Temp 98.1 F (36.7 C)   Ht 5' (1.524 m)   Wt 132 lb 14.4 oz (60.3 kg)   LMP 01/23/2017   BMI 25.96 kg/m   Physical Exam  Constitutional: She is oriented to person, place, and time. She appears well-developed and well-nourished.  HENT:  Head: Normocephalic and atraumatic.  Cardiovascular: Regular rhythm and normal heart sounds.   Pulmonary/Chest: Breath sounds normal.  Neurological: She is alert and oriented to person, place, and time.  Skin: Skin is warm and dry.  Vitals reviewed.       Assessment & Plan:         HTN:  Controlled.   Goal BP <130/80 Continue current medication regimen.  Encourage low salt diet and exercise.   DM:  Continue to FU with Endo clinic as directed,  Continue current regimen.   Jacelyn Pi, NP   [facility]

## 2017-05-02 ENCOUNTER — Ambulatory Visit: Payer: Self-pay | Admitting: Adult Health Nurse Practitioner

## 2017-05-02 VITALS — BP 123/86 | HR 98 | Temp 98.2°F | Wt 136.2 lb

## 2017-05-02 DIAGNOSIS — E119 Type 2 diabetes mellitus without complications: Secondary | ICD-10-CM

## 2017-05-02 LAB — GLUCOSE, POCT (MANUAL RESULT ENTRY): POC GLUCOSE: 138 mg/dL — AB (ref 70–99)

## 2017-05-02 LAB — POCT GLYCOSYLATED HEMOGLOBIN (HGB A1C): HEMOGLOBIN A1C: 6.8

## 2017-05-02 MED ORDER — GLIMEPIRIDE 2 MG PO TABS: 1.0000 mg | ORAL_TABLET | Freq: Every day | ORAL | 4 refills | Status: DC

## 2017-05-02 MED ORDER — HYDROCHLOROTHIAZIDE 12.5 MG PO TABS: 12.5000 mg | ORAL_TABLET | Freq: Every day | ORAL | 2 refills | Status: DC

## 2017-05-02 NOTE — Progress Notes (Signed)
Patient's primary care provider: Virl Axehaplin, Don C, MD    Deanna Zimmerman is 31 y.o. female returns for follow-up of T2DM since January 2018. A1c decrease from 10.3 to 6.8 from April to July. Change in eating habits and exercise. Cut out soda and A1c dropped. Low carb diet and exercising regularly by biking 1/day.   She will no longer need to be followed by [facility]. Graduated.    Other problems include:  Patient Active Problem List   Diagnosis Date Noted   Goiter 02/07/2017   Hypertension 01/03/2017   Diabetes mellitus, new onset (HCC) 11/14/2016     Her current medications include: Current Outpatient Prescriptions  Medication Sig Dispense Refill   glimepiride (AMARYL) 2 MG tablet Take 0.5-1 tablets (1-2 mg total) by mouth daily with breakfast. 100 tablet 4   hydrochlorothiazide (HYDRODIURIL) 12.5 MG tablet Take 1 tablet (12.5 mg total) by mouth daily. 30 tablet 2   metFORMIN (GLUCOPHAGE) 1000 MG tablet Take 1 tablet (1,000 mg total) by mouth 2 (two) times daily with a meal. 180 tablet 1   No current facility-administered medications for this visit.       Exam:  BP 123/86    Pulse 98    Temp 98.2 F (36.8 C)    Wt 136 lb 3.2 oz (61.8 kg)    LMP 05/01/2017 (Exact Date)    BMI 26.60 kg/m   Constitutional:, Alert, oriented, in NAD Eyes: EOMI, lid lag  fundus normal with sharp discs, no retinopathy seen Cardiovascular: Regular rhythm, no murmurs, gallops, rubs.  Respiratory: Lungs clear, no wheezes or rales Gastrointestinal: abdomen, no HSM, no masses Skin: no rashes or lesions. Feet without lesions Neurologic: Gait normal, DTRs normal. Tremor: light touch sensation normal in feet in 5/5 spots bilaterally by monofilament Psychiatric: Oriented, normal mood and affect Heme/lymph/immunologic: no lymphadenopathy   Recent labs:  Results for orders placed or performed in visit on 05/02/17  POCT Glucose (CBG)  Result Value Ref Range   POC Glucose 138 (A) 70 - 99 mg/dl   POCT HgB [id number]  Result Value Ref Range   Hemoglobin A1C 6.8      Assessment and Plan:    1. Diabetes mellitus without complication (HCC) - POCT Glucose (CBG) - POCT HgB A1C   @FOLLOWUP @

## 2017-05-09 ENCOUNTER — Ambulatory Visit: Payer: Self-pay

## 2017-05-25 ENCOUNTER — Encounter: Payer: Self-pay | Admitting: Endocrinology

## 2017-07-05 ENCOUNTER — Encounter: Payer: Self-pay | Admitting: Endocrinology

## 2017-07-05 ENCOUNTER — Telehealth: Payer: Self-pay

## 2017-07-05 DIAGNOSIS — E119 Type 2 diabetes mellitus without complications: Secondary | ICD-10-CM

## 2017-07-05 MED ORDER — HYDROCHLOROTHIAZIDE 12.5 MG PO TABS: 12.5000 mg | ORAL_TABLET | Freq: Every day | ORAL | 2 refills | Status: DC

## 2017-07-05 MED ORDER — METFORMIN HCL 1000 MG PO TABS: 1000.0000 mg | ORAL_TABLET | Freq: Two times a day (BID) | ORAL | 1 refills | Status: DC

## 2017-07-05 NOTE — Telephone Encounter (Signed)
Pt sent in email for refill. [facility].

## 2017-08-16 ENCOUNTER — Ambulatory Visit: Payer: Self-pay | Admitting: Internal Medicine

## 2017-08-16 VITALS — BP 138/100 | HR 105 | Temp 98.0°F | Wt 141.0 lb

## 2017-08-16 DIAGNOSIS — E119 Type 2 diabetes mellitus without complications: Secondary | ICD-10-CM

## 2017-08-16 MED ORDER — GLIMEPIRIDE 2 MG PO TABS: 1.0000 mg | ORAL_TABLET | Freq: Every day | ORAL | 3 refills | Status: DC

## 2017-08-16 NOTE — Progress Notes (Signed)
Nothing to eat this morning.  Blood glucose was 163 at home.

## 2017-08-16 NOTE — Progress Notes (Signed)
   Subjective:    Patient ID: Deanna Zimmerman, female    DOB: [birth year], 31 y.o.   MRN: [id number]  HPI   Pt states that her eyesight is getting worse.   Pt checks her sugar levels twice a day (morning before breakfast and at night). Her levels stay consistent between 150 and 160. Her sugar level has dropped to 60 in the past.  Pt checks her BP every morning with a cuff at home. Range is 120/70.    Allergies as of 08/16/2017   No Known Allergies     Medication List       Accurate as of 08/16/17  9:13 AM. Always use your most recent med list.          glimepiride 2 MG tablet Commonly known as:  AMARYL Take 0.5-1 tablets (1-2 mg total) by mouth daily with breakfast.   hydrochlorothiazide 12.5 MG tablet Commonly known as:  HYDRODIURIL Take 1 tablet (12.5 mg total) by mouth daily.   metFORMIN 1000 MG tablet Commonly known as:  GLUCOPHAGE Take 1 tablet (1,000 mg total) by mouth 2 (two) times daily with a meal.      Patient Active Problem List   Diagnosis Date Noted  . Goiter 02/07/2017  . Hypertension 01/03/2017  . Diabetes mellitus, new onset (HCC) 11/14/2016     Review of Systems     Objective:   Physical Exam  Constitutional: She is oriented to person, place, and time.  Cardiovascular: Normal rate, regular rhythm and normal heart sounds.   Pulmonary/Chest: Effort normal and breath sounds normal.  Neurological: She is alert and oriented to person, place, and time.     BP (!) 138/100   Pulse (!) 105   Temp 98 F (36.7 C)   Wt 141 lb (64 kg)   LMP 08/07/2017   BMI 27.54 kg/m   BP taken manually in exam room: 128/94 (sitting)      Assessment & Plan:   Pt needs refills on prescriptions.  Referral for an eye exam.  Labs ordered today and in 6 months.  Follow up in 6 months in with lab results.

## 2017-08-17 LAB — COMPREHENSIVE METABOLIC PANEL
A/G RATIO: 1.4 (ref 1.2–2.2)
ALT: 10 IU/L (ref 0–32)
AST: 13 IU/L (ref 0–40)
Albumin: 4.3 g/dL (ref 3.5–5.5)
Alkaline Phosphatase: 72 IU/L (ref 39–117)
BUN/Creatinine Ratio: 24 — ABNORMAL HIGH (ref 9–23)
BUN: 12 mg/dL (ref 6–20)
Bilirubin Total: 0.3 mg/dL (ref 0.0–1.2)
CALCIUM: 9.1 mg/dL (ref 8.7–10.2)
CO2: 25 mmol/L (ref 20–29)
Chloride: 98 mmol/L (ref 96–106)
Creatinine, Ser: 0.49 mg/dL — ABNORMAL LOW (ref 0.57–1.00)
GFR, EST AFRICAN AMERICAN: 150 mL/min/{1.73_m2} (ref >59)
GFR, EST NON AFRICAN AMERICAN: 130 mL/min/{1.73_m2} (ref >59)
Globulin, Total: 3.1 g/dL (ref 1.5–4.5)
Glucose: 183 mg/dL — ABNORMAL HIGH (ref 65–99)
POTASSIUM: 4.5 mmol/L (ref 3.5–5.2)
Sodium: 138 mmol/L (ref 134–144)
TOTAL PROTEIN: 7.4 g/dL (ref 6.0–8.5)

## 2017-08-17 LAB — URINALYSIS
BILIRUBIN UA: NEGATIVE
Ketones, UA: NEGATIVE
LEUKOCYTES UA: NEGATIVE
Nitrite, UA: NEGATIVE
PH UA: 7 (ref 5.0–7.5)
PROTEIN UA: NEGATIVE
RBC UA: NEGATIVE
SPEC GRAV UA: 1.026 (ref 1.005–1.030)
Urobilinogen, Ur: 1 mg/dL (ref 0.2–1.0)

## 2017-08-17 LAB — LIPID PANEL
Chol/HDL Ratio: 3.7 ratio (ref 0.0–4.4)
Cholesterol, Total: 176 mg/dL (ref 100–199)
HDL: 48 mg/dL (ref >39)
LDL CALC: 115 mg/dL — AB (ref 0–99)
Triglycerides: 64 mg/dL (ref 0–149)
VLDL CHOLESTEROL CAL: 13 mg/dL (ref 5–40)

## 2017-08-17 LAB — CBC
HEMATOCRIT: 34.6 % (ref 34.0–46.6)
Hemoglobin: 11.5 g/dL (ref 11.1–15.9)
MCH: 28.3 pg (ref 26.6–33.0)
MCHC: 33.2 g/dL (ref 31.5–35.7)
MCV: 85 fL (ref 79–97)
PLATELETS: 387 10*3/uL — AB (ref 150–379)
RBC: 4.06 x10E6/uL (ref 3.77–5.28)
RDW: 13.4 % (ref 12.3–15.4)
WBC: 7 10*3/uL (ref 3.4–10.8)

## 2017-08-17 LAB — TSH: TSH: 0.815 u[IU]/mL (ref 0.450–4.500)

## 2017-08-17 LAB — HEMOGLOBIN A1C

## 2017-08-18 LAB — HEMOGLOBIN A1C
Est. average glucose Bld gHb Est-mCnc: 171 mg/dL
Hgb A1c MFr Bld: 7.6 % — ABNORMAL HIGH (ref 4.8–5.6)

## 2017-08-18 LAB — SPECIMEN STATUS REPORT

## 2017-08-24 ENCOUNTER — Ambulatory Visit: Payer: Self-pay | Admitting: Ophthalmology

## 2017-11-23 ENCOUNTER — Other Ambulatory Visit: Payer: Self-pay

## 2017-11-23 DIAGNOSIS — E119 Type 2 diabetes mellitus without complications: Secondary | ICD-10-CM

## 2017-11-23 MED ORDER — GLIMEPIRIDE 2 MG PO TABS: 1.0000 mg | ORAL_TABLET | Freq: Every day | ORAL | 3 refills | Status: DC

## 2017-11-23 MED ORDER — HYDROCHLOROTHIAZIDE 12.5 MG PO TABS: 12.5000 mg | ORAL_TABLET | Freq: Every day | ORAL | 2 refills | Status: DC

## 2017-12-18 ENCOUNTER — Telehealth: Payer: Self-pay | Admitting: Pharmacy Technician

## 2017-12-18 NOTE — Telephone Encounter (Signed)
Received updated proof of income.  Patient eligible to receive medication assistance at [facility] through 2019, as long as eligibility requirements continue to be met.  [facility]

## 2018-02-01 ENCOUNTER — Other Ambulatory Visit: Payer: Self-pay | Admitting: Internal Medicine

## 2018-02-01 DIAGNOSIS — E119 Type 2 diabetes mellitus without complications: Secondary | ICD-10-CM

## 2018-02-07 ENCOUNTER — Other Ambulatory Visit: Payer: Self-pay

## 2018-02-07 DIAGNOSIS — Z0000 Encounter for general adult medical examination without abnormal findings: Secondary | ICD-10-CM

## 2018-02-07 DIAGNOSIS — E119 Type 2 diabetes mellitus without complications: Secondary | ICD-10-CM

## 2018-02-08 LAB — COMPREHENSIVE METABOLIC PANEL
ALBUMIN: 4.2 g/dL (ref 3.5–5.5)
ALK PHOS: 87 IU/L (ref 39–117)
ALT: 12 IU/L (ref 0–32)
AST: 12 IU/L (ref 0–40)
Albumin/Globulin Ratio: 1.3 (ref 1.2–2.2)
BUN / CREAT RATIO: 25 — AB (ref 9–23)
BUN: 13 mg/dL (ref 6–20)
Bilirubin Total: 0.3 mg/dL (ref 0.0–1.2)
CALCIUM: 9.6 mg/dL (ref 8.7–10.2)
CO2: 26 mmol/L (ref 20–29)
CREATININE: 0.52 mg/dL — AB (ref 0.57–1.00)
Chloride: 97 mmol/L (ref 96–106)
GFR calc non Af Amer: 127 mL/min/{1.73_m2} (ref >59)
GFR, EST AFRICAN AMERICAN: 146 mL/min/{1.73_m2} (ref >59)
GLUCOSE: 230 mg/dL — AB (ref 65–99)
Globulin, Total: 3.3 g/dL (ref 1.5–4.5)
Potassium: 4.8 mmol/L (ref 3.5–5.2)
Sodium: 136 mmol/L (ref 134–144)
TOTAL PROTEIN: 7.5 g/dL (ref 6.0–8.5)

## 2018-02-08 LAB — CBC
HEMOGLOBIN: 11.6 g/dL (ref 11.1–15.9)
Hematocrit: 36.3 % (ref 34.0–46.6)
MCH: 28 pg (ref 26.6–33.0)
MCHC: 32 g/dL (ref 31.5–35.7)
MCV: 88 fL (ref 79–97)
Platelets: 419 10*3/uL — ABNORMAL HIGH (ref 150–379)
RBC: 4.14 x10E6/uL (ref 3.77–5.28)
RDW: 13 % (ref 12.3–15.4)
WBC: 8.1 10*3/uL (ref 3.4–10.8)

## 2018-02-08 LAB — LIPID PANEL
Chol/HDL Ratio: 4.1 ratio (ref 0.0–4.4)
Cholesterol, Total: 205 mg/dL — ABNORMAL HIGH (ref 100–199)
HDL: 50 mg/dL (ref >39)
LDL Calculated: 143 mg/dL — ABNORMAL HIGH (ref 0–99)
Triglycerides: 59 mg/dL (ref 0–149)
VLDL CHOLESTEROL CAL: 12 mg/dL (ref 5–40)

## 2018-02-08 LAB — URINALYSIS
Bilirubin, UA: NEGATIVE
Ketones, UA: NEGATIVE
LEUKOCYTES UA: NEGATIVE
NITRITE UA: NEGATIVE
PH UA: 6.5 (ref 5.0–7.5)
Protein, UA: NEGATIVE
RBC UA: NEGATIVE
SPEC GRAV UA: 1.026 (ref 1.005–1.030)
Urobilinogen, Ur: 1 mg/dL (ref 0.2–1.0)

## 2018-02-08 LAB — HEMOGLOBIN A1C
Est. average glucose Bld gHb Est-mCnc: 255 mg/dL
Hgb A1c MFr Bld: 10.5 % — ABNORMAL HIGH (ref 4.8–5.6)

## 2018-02-08 LAB — MICROALBUMIN, URINE: Microalbumin, Urine: 5.9 ug/mL

## 2018-02-14 ENCOUNTER — Ambulatory Visit: Payer: Self-pay | Admitting: Internal Medicine

## 2018-02-14 ENCOUNTER — Encounter: Payer: Self-pay | Admitting: Internal Medicine

## 2018-02-14 DIAGNOSIS — E119 Type 2 diabetes mellitus without complications: Secondary | ICD-10-CM

## 2018-02-14 MED ORDER — METFORMIN HCL 1000 MG PO TABS: 1000.0000 mg | ORAL_TABLET | Freq: Two times a day (BID) | ORAL | 3 refills | Status: DC

## 2018-02-14 NOTE — Progress Notes (Signed)
   Subjective:    Patient ID: Deanna Zimmerman, female    DOB: 10-18-1986, 32 y.o.   MRN: [id number]  HPI   Pt is here for a f/u on labs. She has no new complaints.   Pt checks her blood sugar levels at work and it is around 180.    Allergies as of 02/14/2018   No Known Allergies     Medication List        Accurate as of 02/14/18  9:10 AM. Always use your most recent med list.          glimepiride 2 MG tablet Commonly known as:  AMARYL Take 0.5-1 tablets (1-2 mg total) by mouth daily with breakfast.   hydrochlorothiazide 12.5 MG capsule Commonly known as:  MICROZIDE TAKE ONE TABLET BY MOUTH EVERY DAY   metFORMIN 1000 MG tablet Commonly known as:  GLUCOPHAGE Take 1 tablet (1,000 mg total) by mouth 2 (two) times daily with a meal.      Patient Active Problem List   Diagnosis Date Noted  . Goiter 02/07/2017  . Hypertension 01/03/2017  . Diabetes mellitus, new onset (HCC) 11/14/2016     Review of Systems     Objective:   Physical Exam  Constitutional: She is oriented to person, place, and time.  Cardiovascular: Normal rate, regular rhythm and normal heart sounds.  Pulmonary/Chest: Effort normal and breath sounds normal.  Neurological: She is alert and oriented to person, place, and time.    BP (!) 136/91   Pulse (!) 101   Temp 99.1 F (37.3 C)   Wt 141 lb 1.6 oz (64 kg)   BMI 27.56 kg/m   BP taken manually in room: 126/90      Assessment & Plan:   Referral for endocrinology clinic to see if medication can be adjusted to improve A1c level. Pt's A1c level has not improved despite oral medication for the last year. Weight has remained stable. Diastolic BP is borderline elevated. Still maintains a resting HR around 100 with a normal TSH.  Rtc in 4 months with labs a week prior.

## 2018-03-13 ENCOUNTER — Ambulatory Visit: Payer: Self-pay

## 2018-03-13 VITALS — BP 146/97 | HR 114 | Temp 98.7°F | Wt 143.6 lb

## 2018-03-13 DIAGNOSIS — E119 Type 2 diabetes mellitus without complications: Secondary | ICD-10-CM

## 2018-03-13 LAB — GLUCOSE, POCT (MANUAL RESULT ENTRY): POC Glucose: 327 mg/dl — AB (ref 70–99)

## 2018-03-13 MED ORDER — PIOGLITAZONE HCL 15 MG PO TABS: 15.0000 mg | ORAL_TABLET | Freq: Every day | ORAL | 3 refills | Status: DC

## 2018-03-13 NOTE — Progress Notes (Deleted)
[facility] Progress Note  03/13/2018  Name: PAXTYN WISDOM  MRN: [id number]  Date of Birth: 05-Jul-1986  Chief Complaint: No chief complaint on file.   HPI: HPI  Past Medical History:  Past Medical History:  Diagnosis Date  . Diabetes mellitus without complication (HCC)   . Goiter 02/07/2017  . Hypertension     Past Surgical History:  Past Surgical History:  Procedure Laterality Date  . CESAREAN SECTION    . FRACTURE SURGERY      Past Family History:  Family History  Problem Relation Age of Onset  . Diabetes Sister     Diabetes Management:  Lab Results  Component Value Date   HGBA1C 10.5 (H) 02/07/2018    Meter here today{question; yes no :20885}  Hypoglycemia in the last 3 months{question; yes no :20885}  Highest blood sugar seen in last 1-2 months? ***  Reported blood sugar average:  Trouble with managing blood sugar :  New Complaints: ***  Personal Diabetes Goal: ***  {WU:9811914}  Clinical Assessment & Plan  ***

## 2018-03-13 NOTE — Progress Notes (Unsigned)
   Subjective:    Patient ID: [name], female    DOB: [birth year], 32 y.o.   MRN: [id number]  HPI  Deanna Zimmerman is a 32 yo F here for Endo Clinic. She has no chief complaints.  Medications: Pt is taking Metformin  twice daily but reports she does forget her night pill 2-3 a week. She is taking Amaryl  once daily.  She checks CBG once daily before lunch and it typically runs >100 with consistency. The highest was 327, which was today and she ate a piece of cake before appt.  Pt reports the last time she felt symptoms of hypoglycemia was several months ago and CBG was 86.  She denies increased thirst or any changes in symptoms. Her weight is stable.  Exercise: she reports she tries to walk 30-45 min/day.  Diet: she reports for breakfast she eats pancakes and sausage; lunch she eats french fries, fried chicken, or pork; dinner she eats potatoes, meat, and a veggie. She drinks water and unsweet tea. Pt reports 37mo ago she was watching her diet and eating less fried food.  Family history of DM on her dad's side and her twin sister had GDM.  Patient Active Problem List   Diagnosis Date Noted  . Goiter 02/07/2017  . Hypertension 01/03/2017  . Diabetes mellitus, new onset (HCC) 11/14/2016   Allergies as of 03/13/2018   No Known Allergies     Medication List        Accurate as of 03/13/18  7:21 PM. Always use your most recent med list.          glimepiride 2 MG tablet Commonly known as:  AMARYL Take 0.5-1 tablets (1-2 mg total) by mouth daily with breakfast.   hydrochlorothiazide 12.5 MG capsule Commonly known as:  MICROZIDE TAKE ONE TABLET BY MOUTH EVERY DAY   metFORMIN 1000 MG tablet Commonly known as:  GLUCOPHAGE Take 1 tablet (1,000 mg total) by mouth 2 (two) times daily with a meal.        Review of Systems Last A1c on 4/17 was 10.5, which is up from 7.6 37mo ago.     Objective:   Physical Exam  Constitutional: She is oriented to person, place,  and time. She appears well-developed and well-nourished.  Cardiovascular: Normal rate, regular rhythm and normal heart sounds.  Pulmonary/Chest: Effort normal and breath sounds normal.  Neurological: She is alert and oriented to person, place, and time.  Vitals reviewed.   BP (!) 146/97 (BP Location: Left Arm, Patient Position: Sitting)   Pulse (!) 114   Temp 98.7 F (37.1 C) (Oral)   Wt 143 lb 9.6 oz (65.1 kg)   BMI 28.04 kg/m      Assessment & Plan:   Discussed with pt making small changes in her diet. Encouraged pt to pick one meal to make small changes with increased veggies.   DM:  Begin Actos  daily.  Encouraged pt to take at least 1/2 pill of Metformin at night if she forgets at dinner.  HTN: Pt will bring self readings at next visit.  Will reevaluate med at next visit.   F/u in 2 mo for med eval and monitor HTN.

## 2018-03-13 NOTE — Progress Notes (Deleted)
   Subjective:    Patient ID: Deanna Zimmerman, female    DOB: Jul 28, 1986, 32 y.o.   MRN: [id number]  HPI    Review of Systems     Objective:   Physical Exam        Assessment & Plan:

## 2018-04-17 ENCOUNTER — Ambulatory Visit
Admission: EM | Admit: 2018-04-17 | Discharge: 2018-04-17 | Disposition: A | Discharge status: Home / Self Care | Payer: Self-pay | Attending: Family Medicine | Admitting: Family Medicine

## 2018-04-17 ENCOUNTER — Other Ambulatory Visit: Payer: Self-pay

## 2018-04-17 ENCOUNTER — Encounter: Payer: Self-pay | Admitting: Emergency Medicine

## 2018-04-17 DIAGNOSIS — H02843 Edema of right eye, unspecified eyelid: Secondary | ICD-10-CM

## 2018-04-17 DIAGNOSIS — H5789 Other specified disorders of eye and adnexa: Secondary | ICD-10-CM

## 2018-04-17 MED ORDER — OLOPATADINE HCL 0.2 % OP SOLN: OPHTHALMIC | 0 refills | Status: DC

## 2018-04-17 NOTE — Discharge Instructions (Signed)
Benadryl at night.  Zyrtec during the day.  Eye drop as prescribed.  Dr. Adriana Simasook

## 2018-04-17 NOTE — ED Triage Notes (Signed)
Patient c/o watery drainage, itchy right eye that started Saturday.  Patient reports right swelling that started yesterday.

## 2018-04-17 NOTE — ED Provider Notes (Signed)
[facility]    CSN: [id number] Arrival date & time: 04/17/18  1615  History   Chief Complaint Chief Complaint  Patient presents with  . Eye Problem   HPI  32 year old female presents with the above complaint.  Patient reports that on Saturday her right eye was itching and watering.  On Sunday she noted swelling.  No reports of eye redness.  No medications or interventions tried.  No reported sick contacts.  No other associated symptoms.  No visual disturbance.  No other complaints or concerns at this time.  Past Medical History:  Diagnosis Date  . Diabetes mellitus without complication (HCC)   . Goiter 02/07/2017  . Hypertension    Patient Active Problem List   Diagnosis Date Noted  . Goiter 02/07/2017  . Hypertension 01/03/2017  . Diabetes mellitus, new onset (HCC) 11/14/2016   Past Surgical History:  Procedure Laterality Date  . CESAREAN SECTION    . FRACTURE SURGERY     OB History   None    Home Medications    Prior to Admission medications   Medication Sig Start Date End Date Taking? Authorizing Provider  glimepiride (AMARYL) 2 MG tablet Take 0.5-1 tablets (1-2 mg total) by mouth daily with breakfast. 11/23/17  Yes Chaplin, Jimmie Mollyon C, MD  hydrochlorothiazide (MICROZIDE) 12.5 MG capsule TAKE ONE TABLET BY MOUTH EVERY DAY 02/01/18  Yes Virl Axehaplin, Don C, MD  metFORMIN (GLUCOPHAGE) 1000 MG tablet Take 1 tablet (1,000 mg total) by mouth 2 (two) times daily with a meal. 02/14/18  Yes Chaplin, Jimmie Mollyon C, MD  pioglitazone (ACTOS) 15 MG tablet Take 1 tablet (15 mg total) by mouth daily. 03/13/18  Yes Caraccio, Lianne Cureonald M, MD  Olopatadine HCl 0.2 % SOLN 1 drop to affected eye daily. 04/17/18   Tommie Samsook, Marlyne Totaro G, DO   Family History Family History  Problem Relation Age of Onset  . Diabetes Sister    Social History Social History   Tobacco Use  . Smoking status: Never Smoker  . Smokeless tobacco: Never Used  Substance Use Topics  . Alcohol use: Never    Frequency: Never    . Drug use: Never    Allergies   Patient has no known allergies.  Review of Systems Review of Systems  Constitutional: Negative.   Eyes: Positive for itching. Negative for redness and visual disturbance.   Physical Exam Triage Vital Signs ED Triage Vitals  Enc Vitals Group     BP 04/17/18 1638 (!) 177/116     Pulse Rate 04/17/18 1638 (!) 120     Resp 04/17/18 1638 16     Temp 04/17/18 1638 99.4 F (37.4 C)     Temp Source 04/17/18 1638 Oral     SpO2 04/17/18 1638 100 %     Weight 04/17/18 1636 144 lb (65.3 kg)     Height 04/17/18 1636 5' (1.524 m)     Head Circumference --      Peak Flow --      Pain Score 04/17/18 1636 0     Pain Loc --      Pain Edu? --      Excl. in GC? --    Updated Vital Signs BP (!) 177/116 (BP Location: Left Arm) Comment: Dr. Adriana Simasook notified  Pulse (!) 120   Temp 99.4 F (37.4 C) (Oral)   Resp 16   Ht 5' (1.524 m)   Wt 144 lb (65.3 kg)   LMP 04/10/2018 (Approximate)   SpO2 100%  BMI 28.12 kg/m   Visual Acuity Right Eye Distance: 20/15 corrected Left Eye Distance: 20/15 corrected Bilateral Distance: 20/13 corrected  Right Eye Near:   Left Eye Near:    Bilateral Near:     Physical Exam  Constitutional: She is oriented to person, place, and time. She appears well-developed. No distress.  HENT:  Head: Normocephalic and atraumatic.  Eyes:  Right eye with periorbital edema.  Normal conjunctiva.  No evidence of stye or chalazion.  No erythema.  No warmth.  Cardiovascular: Normal rate and regular rhythm.  Pulmonary/Chest: Effort normal and breath sounds normal. She has no wheezes. She has no rales.  Neurological: She is alert and oriented to person, place, and time.  Psychiatric: She has a normal mood and affect. Her behavior is normal.  Nursing note and vitals reviewed.  UC Treatments / Results  Labs (all labs ordered are listed, but only abnormal results are displayed) Labs Reviewed - No data to  display  EKG None  Radiology No results found.  Procedures Procedures (including critical care time)  Medications Ordered in UC Medications - No data to display  Initial Impression / Assessment and Plan / UC Course  I have reviewed the triage vital signs and the nursing notes.  Pertinent labs & imaging results that were available during my care of the patient were reviewed by me and considered in my medical decision making (see chart for details).    32 year old female presents with right eye swelling.  Appears to be an allergic reaction.  No evidence of periorbital cellulitis.  Benadryl at night.  Nonsedating antihistamine during the day.  Pataday as prescribed.  Final Clinical Impressions(s) / UC Diagnoses   Final diagnoses:  Eye swelling, right     Discharge Instructions     Benadryl at night.  Zyrtec during the day.  Eye drop as prescribed.  Dr. Adriana Simas     ED Prescriptions    Medication Sig Dispense Auth. Provider   Olopatadine HCl 0.2 % SOLN 1 drop to affected eye daily. 2.5 mL Tommie Sams, DO     Controlled Substance Prescriptions [place] Controlled Substance Registry consulted? Not Applicable   Tommie Sams, DO 04/17/18 1849

## 2018-05-08 ENCOUNTER — Ambulatory Visit: Payer: Self-pay

## 2018-05-08 NOTE — Progress Notes (Deleted)
Follow up Diabetes/ [facility]     Patient ID: Deanna Zimmerman, female   DOB: 08-23-1986, 32 y.o.   MRN: [id number] Assessment:     1. Diabetes mellitus, new onset (HCC)   2. Goiter   3. Essential hypertension         Plan:     Plan: patient instructions    There are no Patient Instructions on file for this visit.   No orders of the defined types were placed in this encounter.  Subjective:  From last visit : HPI  Deanna Zimmerman is a 32 yo F here for Endo Clinic. She has no chief complaints.  Medications: Pt is taking Metformin 1000mg  twice daily but reports she does forget her night pill 2-3 a week. She is taking Amaryl 2mg  once daily.  She checks CBG once daily before lunch and it typically runs >100 with consistency. The highest was 327, which was today and she ate a piece of cake before appt.  Pt reports the last time she felt symptoms of hypoglycemia was several months ago and CBG was 86.  She denies increased thirst or any changes in symptoms. Her weight is stable.  Exercise: she reports she tries to walk 30-45 min/day.  Diet: she reports for breakfast she eats pancakes and sausage; lunch she eats french fries, fried chicken, or pork; dinner she eats potatoes, meat, and a veggie. She drinks water and unsweet tea. Pt reports 48mo ago she was watching her diet and eating less fried food.  Family history of DM on her dad's side and her twin sister had GDM.   Review of Systems     Deanna Zimmerman  has a past medical history of Diabetes mellitus without complication (HCC), Goiter (02/07/2017), and Hypertension.   Deanna Zimmerman family history includes Diabetes in her sister.  Deanna Zimmerman  reports that she has never smoked. She has never used smokeless tobacco. She reports that she does not drink alcohol or use drugs.   Current Outpatient Medications:  .  glimepiride (AMARYL) 2 MG tablet, Take 0.5-1 tablets (1-2 mg total) by mouth daily with  breakfast., Disp: 90 tablet, Rfl: 3 .  hydrochlorothiazide (MICROZIDE) 12.5 MG capsule, TAKE ONE TABLET BY MOUTH EVERY DAY, Disp: 90 capsule, Rfl: 0 .  metFORMIN (GLUCOPHAGE) 1000 MG tablet, Take 1 tablet (1,000 mg total) by mouth 2 (two) times daily with a meal., Disp: 180 tablet, Rfl: 3 .  Olopatadine HCl 0.2 % SOLN, 1 drop to affected eye daily., Disp: 2.5 mL, Rfl: 0 .  pioglitazone (ACTOS) 15 MG tablet, Take 1 tablet (15 mg total) by mouth daily., Disp: 30 tablet, Rfl: 3  No Known Allergies Objective:    Physical Exam      Data : I have personally reviewed pertinent labs and imaging studies, if indicated,  with the patient in clinic today.   Lab Orders  No laboratory test(s) ordered today    HC Readings from Last 3 Encounters:  [facility]    Wt Readings from Last 3 Encounters:  04/17/18 144 lb (65.3 kg)  03/13/18 143 lb 9.6 oz (65.1 kg)  02/14/18 141 lb 1.6 oz (64 kg)

## 2018-06-05 ENCOUNTER — Other Ambulatory Visit: Payer: Self-pay

## 2018-06-05 DIAGNOSIS — E119 Type 2 diabetes mellitus without complications: Secondary | ICD-10-CM

## 2018-06-05 MED ORDER — PIOGLITAZONE HCL 15 MG PO TABS: 15.0000 mg | ORAL_TABLET | Freq: Every day | ORAL | 3 refills | Status: DC

## 2018-06-06 ENCOUNTER — Other Ambulatory Visit: Payer: Self-pay

## 2018-06-06 DIAGNOSIS — E119 Type 2 diabetes mellitus without complications: Secondary | ICD-10-CM

## 2018-06-07 LAB — URINALYSIS
Bilirubin, UA: NEGATIVE
Ketones, UA: NEGATIVE
LEUKOCYTES UA: NEGATIVE
Nitrite, UA: NEGATIVE
PH UA: 5.5 (ref 5.0–7.5)
Protein, UA: NEGATIVE
RBC UA: NEGATIVE
SPEC GRAV UA: 1.022 (ref 1.005–1.030)
Urobilinogen, Ur: 0.2 mg/dL (ref 0.2–1.0)

## 2018-06-07 LAB — HEMOGLOBIN A1C
Est. average glucose Bld gHb Est-mCnc: 269 mg/dL
Hgb A1c MFr Bld: 11 % — ABNORMAL HIGH (ref 4.8–5.6)

## 2018-06-07 LAB — COMPREHENSIVE METABOLIC PANEL
ALBUMIN: 4.1 g/dL (ref 3.5–5.5)
ALK PHOS: 92 IU/L (ref 39–117)
ALT: 8 IU/L (ref 0–32)
AST: 11 IU/L (ref 0–40)
Albumin/Globulin Ratio: 1.3 (ref 1.2–2.2)
BUN / CREAT RATIO: 19 (ref 9–23)
BUN: 12 mg/dL (ref 6–20)
Bilirubin Total: 0.2 mg/dL (ref 0.0–1.2)
CALCIUM: 9.9 mg/dL (ref 8.7–10.2)
CO2: 26 mmol/L (ref 20–29)
CREATININE: 0.62 mg/dL (ref 0.57–1.00)
Chloride: 98 mmol/L (ref 96–106)
GFR calc Af Amer: 138 mL/min/{1.73_m2} (ref >59)
GFR, EST NON AFRICAN AMERICAN: 120 mL/min/{1.73_m2} (ref >59)
GLOBULIN, TOTAL: 3.1 g/dL (ref 1.5–4.5)
Glucose: 292 mg/dL — ABNORMAL HIGH (ref 65–99)
Potassium: 4.8 mmol/L (ref 3.5–5.2)
SODIUM: 138 mmol/L (ref 134–144)
Total Protein: 7.2 g/dL (ref 6.0–8.5)

## 2018-06-07 LAB — CBC
HEMATOCRIT: 36.4 % (ref 34.0–46.6)
HEMOGLOBIN: 11.6 g/dL (ref 11.1–15.9)
MCH: 27.7 pg (ref 26.6–33.0)
MCHC: 31.9 g/dL (ref 31.5–35.7)
MCV: 87 fL (ref 79–97)
Platelets: 378 10*3/uL (ref 150–450)
RBC: 4.19 x10E6/uL (ref 3.77–5.28)
RDW: 13.2 % (ref 12.3–15.4)
WBC: 7 10*3/uL (ref 3.4–10.8)

## 2018-06-07 LAB — MICROALBUMIN, URINE: Microalbumin, Urine: 12.6 ug/mL

## 2018-06-07 LAB — TSH: TSH: 1.18 u[IU]/mL (ref 0.450–4.500)

## 2018-06-12 ENCOUNTER — Ambulatory Visit: Payer: Self-pay

## 2018-06-13 ENCOUNTER — Encounter: Payer: Self-pay | Admitting: Internal Medicine

## 2018-06-13 ENCOUNTER — Ambulatory Visit: Payer: Self-pay | Admitting: Internal Medicine

## 2018-06-13 VITALS — BP 122/86 | HR 97 | Temp 98.6°F | Ht 60.0 in | Wt 143.2 lb

## 2018-06-13 DIAGNOSIS — E119 Type 2 diabetes mellitus without complications: Secondary | ICD-10-CM

## 2018-06-13 NOTE — Progress Notes (Signed)
Subjective:    Patient ID: Deanna Zimmerman, female    DOB: 08/01/1986, 32 y.o.   MRN: [id number]  HPI  Pt returns for 4 month follow up appointment.   Pt reports that last week while she was working she got shaky and she checked FSBS and it was 77. This happened twice in one week at around the same time.    In mornings, patient reports that FSBS is in the 150's, in the afternoons before dinner runs around 200's.   Pt reports that for breakfast around 7-8AM she typically eats bacon, fried egg, and toast and then when she is working she doesn't eat again till her break around 2:00pm.    Review of Systems Patient Active Problem List   Diagnosis Date Noted  . Goiter 02/07/2017  . Hypertension 01/03/2017  . Diabetes mellitus, new onset (HCC) 11/14/2016   Allergies as of 06/13/2018   No Known Allergies     Medication List        Accurate as of 06/13/18  9:21 AM. Always use your most recent med list.          glimepiride 2 MG tablet Commonly known as:  AMARYL Take 0.5-1 tablets (1-2 mg total) by mouth daily with breakfast.   hydrochlorothiazide 12.5 MG capsule Commonly known as:  MICROZIDE TAKE ONE TABLET BY MOUTH EVERY DAY   metFORMIN 1000 MG tablet Commonly known as:  GLUCOPHAGE Take 1 tablet (1,000 mg total) by mouth 2 (two) times daily with a meal.   Olopatadine HCl 0.2 % Soln 1 drop to affected eye daily.   pioglitazone 15 MG tablet Commonly known as:  ACTOS Take 1 tablet (15 mg total) by mouth daily.          Objective:   Physical Exam  Constitutional: She is oriented to person, place, and time.  Cardiovascular: Normal rate, regular rhythm and normal heart sounds.  Pulmonary/Chest: Effort normal and breath sounds normal.  Neurological: She is alert and oriented to person, place, and time.    BP 122/86   Pulse 97   Temp 98.6 F (37 C) (Oral)   Ht 5' (1.524 m)   Wt 143 lb 3.2 oz (65 kg)   LMP 04/25/2018 (Within Days)   BMI 27.97 kg/m        Assessment & Plan:   Experiencing peaks in blood sugar, may need medicine adjustment. FU with [facility].   Provider recommended that pt eat a snack around 10:30AM to ensure that blood sugar does not drop. Pt normally eats breakfast around 7-8AM then does not eat again till 2PM due to work schedule.    FU in 3 months with labs a week prior (A1C, Met C, CBC, UA, Lipid, and TSH)  

## 2018-07-10 ENCOUNTER — Ambulatory Visit: Payer: Self-pay | Admitting: Nephrology

## 2018-07-10 VITALS — BP 133/93 | HR 107 | Temp 97.9°F | Ht 59.0 in | Wt 143.0 lb

## 2018-07-10 DIAGNOSIS — E119 Type 2 diabetes mellitus without complications: Secondary | ICD-10-CM

## 2018-07-10 LAB — GLUCOSE, POCT (MANUAL RESULT ENTRY): POC GLUCOSE: 259 mg/dL — AB (ref 70–99)

## 2018-07-10 MED ORDER — METFORMIN HCL ER (MOD) 1000 MG PO TB24: 1000.0000 mg | ORAL_TABLET | Freq: Two times a day (BID) | ORAL | 11 refills | Status: DC

## 2018-07-10 NOTE — Patient Instructions (Signed)
Check blood glucose in the morning, before breakfast/food. Check blood glucose when having feelings of "low". If >150s, then do not take any sugar, but rest and see if the symptoms go away.  Changes to metformin extended release: 1000mg  twice a day or 2 pills of 1000mg  once a day.  Take 1 pill of glimiperide a day. Continue with pioglitazone 1 pill a day.

## 2018-07-10 NOTE — Progress Notes (Signed)
Follow up Diabetes/ [facility]     Patient ID: Deanna Zimmerman, female   DOB: 12-11-1985, 32 y.o.   MRN: [id number] Assessment:  Deanna Zimmerman is a 32 y.o. female who is seen in follow up for Diabetes mellitus without complication (HCC) [E11.9] at the request of Virl Axe, MD.  Encounter Diagnoses 1. Diabetes mellitus without complication (HCC)     Plan:  1. Diabetes mellitus Patient takes metformin 1000mg  BID, glimeperide 2mg  daily, and pioglitazone. Patient not tolerating metformin well with symptoms of stomachache and diarrhea almost every day even when taking metformin with food. Patient checks bg at lunchtime and in the evening. Patient reports episodes of low, with the most recent noted approximately 2 weeks ago. Denies any episodes of low in the evening. - Cont. Pioglitazone - Metformin extended release, 1000mg  BID - Take glimeperide, 1 pill/day - Patient instructed to take bg in the morning and for episodes of low. - UMic due 05/2019 - f/u 08/2018: conduct monofilament foot exam, schedule Ophtho for DR screening  Patient Instructions  Check blood glucose in the morning, before breakfast/food. Check blood glucose when having feelings of "low". If >150s, then do not take any sugar, but rest and see if the symptoms go away.  Changes to metformin extended release: 1000mg  twice a day or 2 pills of 1000mg  once a day.  Take 1 pill of glimiperide a day. Continue with pioglitazone 1 pill a day.   Orders Placed This Encounter  Procedures  . POCT Glucose (CBG)     Subjective:  Medications taken: metformin, glimeperide, pioglitazone. Has upset stomach, diarrhea with the metformin (takes metformin with food). Denies shaking, chills. Feels like passing out when sugar drops low. Couple weeks ago at last visit week. No episodes of low in the past week. Denies ever passing out. Diet: Mostly baked food, chicken, fish. Water or unsweetened tea as beverages. Denies  smoking. Drinks alcohol (once every 2-3 month). No illicit drug use. Exercise: Walk for 30 minutes (treadmill) mostly every day. Checks blood glucose twice a day: before eating lunch & at nighttime, ranges 100-260s.   Diabetes  Pertinent negatives for hypoglycemia include no dizziness. Pertinent negatives for diabetes include no chest pain and no fatigue.     Review of Systems  Constitutional: Negative for chills and fatigue.  Cardiovascular: Negative for chest pain.  Gastrointestinal: Positive for abdominal pain and diarrhea. Negative for blood in stool.  Genitourinary: Negative for difficulty urinating and hematuria.  Neurological: Positive for light-headedness. Negative for dizziness.    Deanna Zimmerman  has a past medical history of Diabetes mellitus without complication (HCC), Goiter (02/07/2017), and Hypertension.  Family History, Social History, current Medications and allergies reviewed and updated in Epic.  Objective:    Blood pressure (!) 133/93, pulse (!) 107, temperature 97.9 F (36.6 C), height 4\' 11"  (1.499 m), weight 143 lb (64.9 kg), last menstrual period 06/24/2018. Physical Exam  Constitutional: She is oriented to person, place, and time. She appears well-developed and well-nourished. No distress.  Eyes: Conjunctivae are normal. Right eye exhibits no discharge. Left eye exhibits no discharge. No scleral icterus.  Cardiovascular: Regular rhythm and normal heart sounds. Exam reveals no gallop and no friction rub.  No murmur heard. Pulmonary/Chest: Effort normal and breath sounds normal. No stridor. No respiratory distress. She has no wheezes. She has no rales.  Neurological: She is alert and oriented to person, place, and time.  Skin: She is not diaphoretic.  Vitals reviewed.  Data : I have personally reviewed pertinent labs and imaging studies, if indicated,  with the patient in clinic today.    Lab Orders     POCT Glucose (CBG)  HC Readings from Last 3  Encounters:  [facility]    Wt Readings from Last 3 Encounters:  07/10/18 143 lb (64.9 kg)  06/13/18 143 lb 3.2 oz (65 kg)  04/17/18 144 lb (65.3 kg)

## 2018-07-11 ENCOUNTER — Other Ambulatory Visit: Payer: Self-pay | Admitting: Internal Medicine

## 2018-07-11 DIAGNOSIS — E119 Type 2 diabetes mellitus without complications: Secondary | ICD-10-CM

## 2018-08-20 ENCOUNTER — Other Ambulatory Visit: Payer: Self-pay | Admitting: Adult Health Nurse Practitioner

## 2018-08-20 DIAGNOSIS — E119 Type 2 diabetes mellitus without complications: Secondary | ICD-10-CM

## 2018-08-30 ENCOUNTER — Ambulatory Visit: Payer: Self-pay | Admitting: Ophthalmology

## 2018-09-10 ENCOUNTER — Other Ambulatory Visit: Payer: Self-pay | Admitting: Internal Medicine

## 2018-09-10 DIAGNOSIS — E119 Type 2 diabetes mellitus without complications: Secondary | ICD-10-CM

## 2018-09-11 ENCOUNTER — Ambulatory Visit: Payer: Self-pay | Admitting: Endocrinology

## 2018-09-11 VITALS — BP 141/99 | HR 110 | Temp 98.1°F | Ht 60.0 in | Wt 144.4 lb

## 2018-09-11 DIAGNOSIS — E119 Type 2 diabetes mellitus without complications: Secondary | ICD-10-CM

## 2018-09-11 DIAGNOSIS — E1165 Type 2 diabetes mellitus with hyperglycemia: Secondary | ICD-10-CM

## 2018-09-11 DIAGNOSIS — I10 Essential (primary) hypertension: Secondary | ICD-10-CM

## 2018-09-11 DIAGNOSIS — O24113 Pre-existing diabetes mellitus, type 2, in pregnancy, third trimester: Secondary | ICD-10-CM | POA: Insufficient documentation

## 2018-09-11 LAB — POCT GLYCOSYLATED HEMOGLOBIN (HGB A1C): HEMOGLOBIN A1C: 10.8 % — AB (ref 4.0–5.6)

## 2018-09-11 LAB — GLUCOSE, POCT (MANUAL RESULT ENTRY): POC Glucose: 208 mg/dl — AB (ref 70–99)

## 2018-09-11 MED ORDER — INSULIN GLARGINE 100 UNIT/ML SOLOSTAR PEN: 20.0000 [IU] | PEN_INJECTOR | Freq: Every day | SUBCUTANEOUS | 3 refills | Status: DC

## 2018-09-11 MED ORDER — PIOGLITAZONE HCL 30 MG PO TABS: 30.0000 mg | ORAL_TABLET | Freq: Every day | ORAL | 3 refills | Status: DC

## 2018-09-11 MED ORDER — LISINOPRIL 10 MG PO TABS: 10.0000 mg | ORAL_TABLET | Freq: Every day | ORAL | 2 refills | Status: DC

## 2018-09-11 NOTE — Progress Notes (Signed)
Follow up Diabetes/ [facility]     Patient ID: Deanna Zimmerman, female   DOB: Dec 11, 1985, 32 y.o.   MRN: [id number] Assessment:  Deanna Zimmerman is a 32 y.o. female who is seen in follow up for Diabetes mellitus without complication (HCC) [E11.9] at the request of Deanna Axe, MD.  Encounter Diagnoses 1. Diabetes mellitus without complication (HCC)   2. Essential hypertension   3. Type 2 diabetes mellitus with hyperglycemia, without long-term current use of insulin (HCC) Chronic    Assessment    Plan:     Pt endorses interest in improving physical activity and exercise to lower blood sugar during the week. Encouraged  1. Uncontrolled T2DM with A1c 10.8% on 09/11/2018 - should be started on Lantus 20units in the evening in December at visit with Dr. Candelaria Stagers. Will increase pioglitazone to 30mg  today. 2. Uncontrolled HTN - will add 10mg  Lisinopril 3. Hypercholesterolemia - should add a statin at December visit with Dr. Candelaria Stagers  PT instructed to check BS 2x daily on rotating schedule.    Subjective:  HPI Patient presents for f/u care for T2DM. At last visit, patient endorsed abdominal discomfort and diarrhea due to metformin use. Switched to ER metformin 1000mg  BID 2 months ago and endorses significant improvement in symptoms. Pt very excited to continue taking ER metformin. Pt continues glimepiride 2 mg daily with breakfast and pioglitazone 15 mg daily in addition to metformin.   POCT A1C today is 10.8%, slight improvement over 11.0% on 06/06/18.  Pt instructed to check BS every morning (reports checking ~4x weekly in the morning, sometimes forgets). Reports averages of 230 in the morning. Checks BS intermittently before lunch, averages around 160.  Pt visited optometrist last week, reports no changes in vision or diabetic retinopathy per optometrist's report.  Diet: breakfast: sandwich, sausage, egg, toast lunch: chicken, fries  dinner: baked chicken, vegetable  (greens), potatoes drink: water, lemonade, unsweet tea snacks: chips, candy  Exercise: walks while daughter is at softball during the fall, but has stopped recently. Has a treadmill at home and wants to start exercising again. Would be excited to walk 20-30 minutes in the evening each day.  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 Enjoys job, is on her feet for much of the day in [employer].  Review of Systems  Denies polyuria/dipsia Denies numbness or tingling in extremities Denies changes in vision Denies SOB, chest pain, dyspnea on exertion  Deanna Zimmerman  has a past medical history of Diabetes mellitus without complication (HCC), Goiter (02/07/2017), and Hypertension.  Family History, Social History, current Medications and allergies reviewed and updated in Epic.  Objective:     Vitals:   09/11/18 1813  BP: (!) 141/99  Pulse: (!) 110  Temp: 98.1 F (36.7 C)   Current Outpatient Medications on File Prior to Visit  Medication Sig Dispense Refill  . glimepiride (AMARYL) 2 MG tablet Take 0.5-1 tablets (1-2 mg total) by mouth daily with breakfast. (Patient taking differently: Take by mouth daily with breakfast. ) 90 tablet 3  . hydrochlorothiazide (MICROZIDE) 12.5 MG capsule TAKE ONE CAPSULE BY MOUTH EVERY DAY 90 capsule 0  . metFORMIN (GLUMETZA) 1000 MG (MOD) 24 hr tablet Take 1 tablet (1,000 mg total) by mouth 2 (two) times daily with a meal. 60 tablet 11   No current facility-administered medications on file prior to visit.      Physical Exam CV - regular rate, no murmurs rubs or gallops, slightly  tachycardic Pulm - CTAB Foot - sensate bilaterally to monofilament exam in 5/5 locations   Data : I have personally reviewed pertinent labs and imaging studies, if indicated,  with the patient in clinic today.    Lab Orders     POCT HgB A1C     POCT Glucose (CBG)  HC Readings from Last 3 Encounters:  [facility]    Wt Readings from Last  3 Encounters:  09/11/18 144 lb 6.4 oz (65.5 kg)  07/10/18 143 lb (64.9 kg)  06/13/18 143 lb 3.2 oz (65 kg)    I,Floride Hutmacher, MD reviewed all documentation including medications, vitals, physical exam and diagnosis and orders . All are accurate and complete. I agree with Assessment and plan of the medical student  Arrie AranGauri Adeja Sarratt, MD  09/11/2018

## 2018-09-12 ENCOUNTER — Telehealth: Payer: Self-pay | Admitting: Pharmacist

## 2018-09-12 IMAGING — CT CT ANGIO CHEST
2 of 6 series · 19 of 46 positions shown · IV contrast (APPLIED)
Comparison: Chest radiograph dated 11/14/2016

CLINICAL DATA: 31-year-old female with shortness of breath and
tachycardia. Evaluate for PE.

EXAM:
CT ANGIOGRAPHY CHEST WITH CONTRAST
TECHNIQUE: Multidetector CT imaging of the chest was performed using the
standard protocol during bolus administration of intravenous
contrast. Multiplanar CT image reconstructions and MIPs were
obtained to evaluate the vascular anatomy.
CONTRAST:  75 cc Isovue 370

[Series 5: thins · axial · 0.63mm/px · z∈[-576,-356]mm · 17 of 242 slices shown]
[im 11/242  lung]
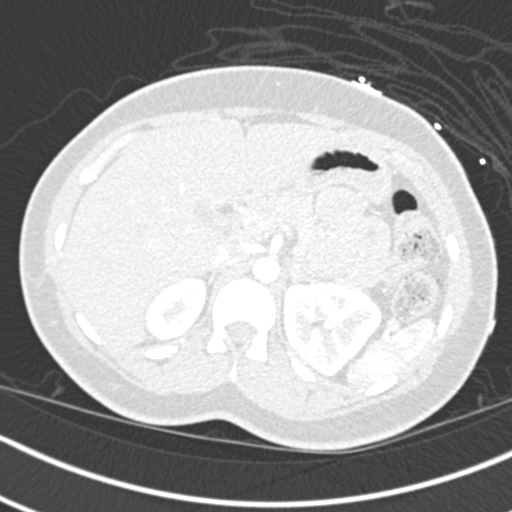
[im 21/242  soft-tissue]
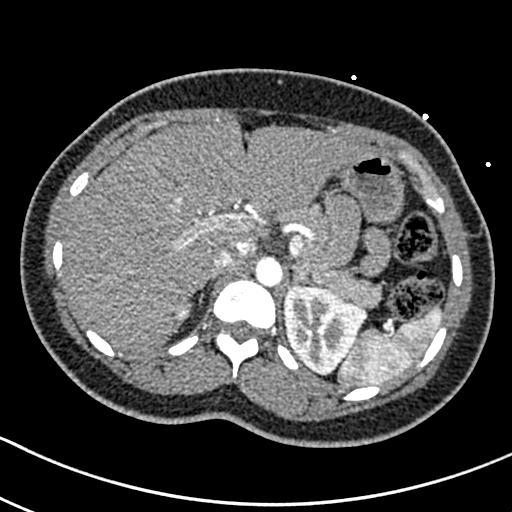
[im 42/242  lung]
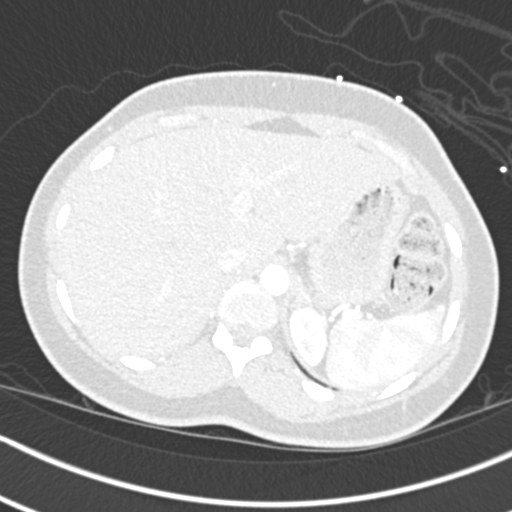
[im 53/242  soft-tissue]
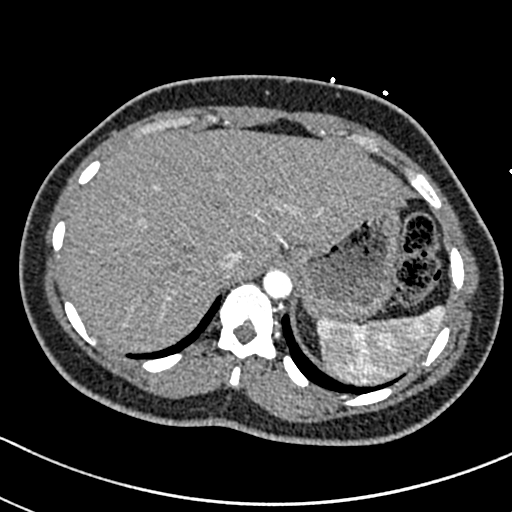
[im 63/242  lung]
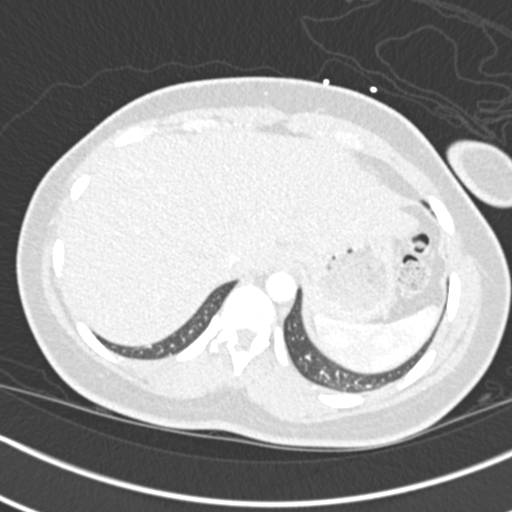
[im 84/242  soft-tissue]
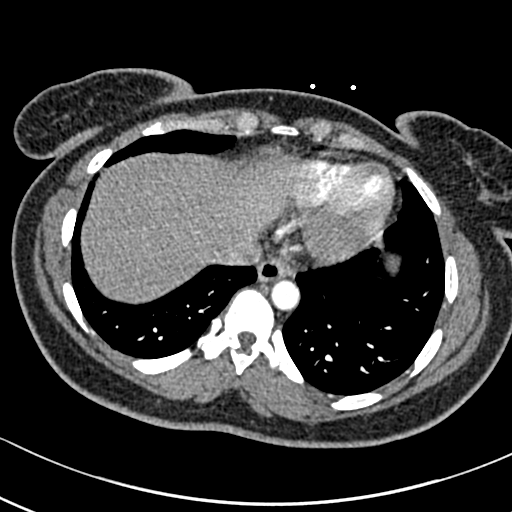
[im 95/242  lung]
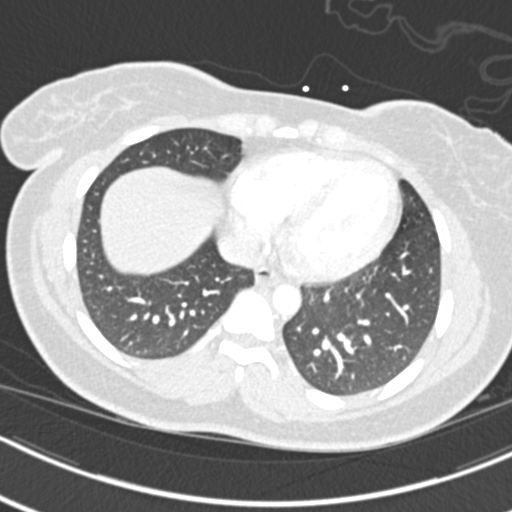
[im 105/242  soft-tissue]
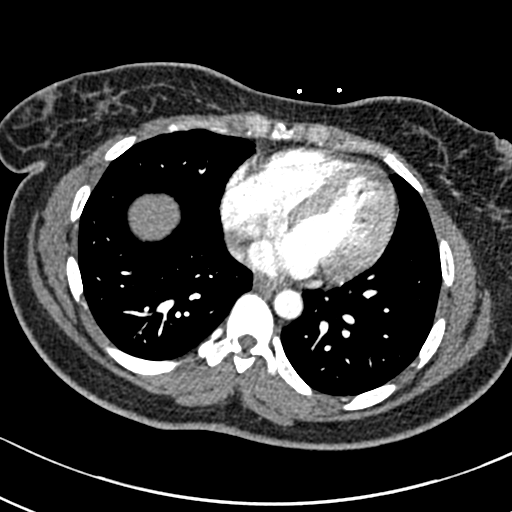
[im 126/242  lung]
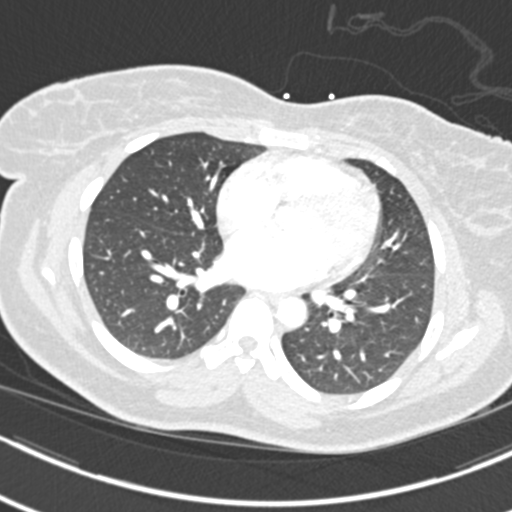
[im 137/242  soft-tissue]
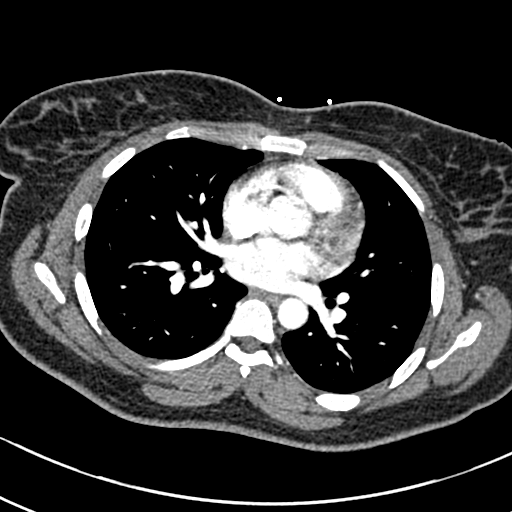
[im 147/242  lung]
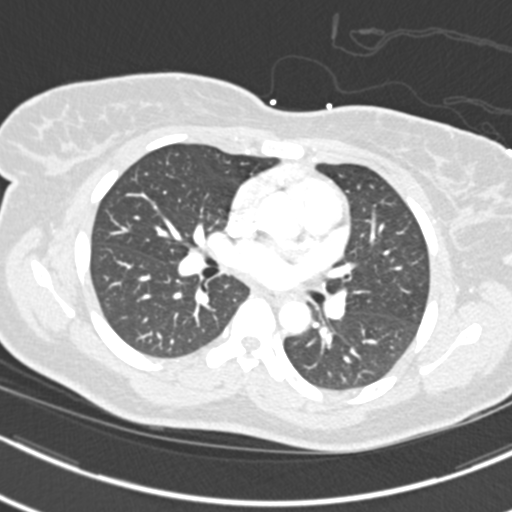
[im 158/242  soft-tissue]
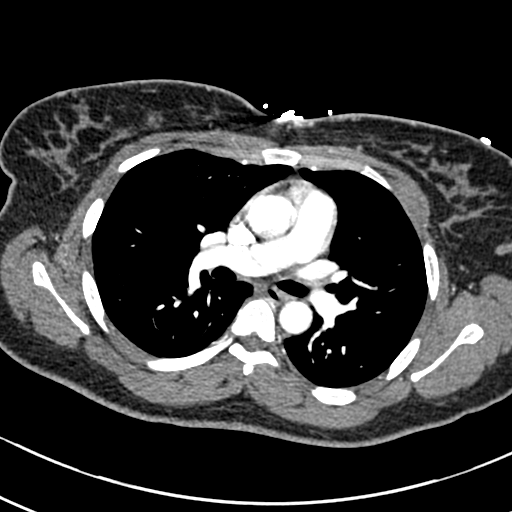
[im 179/242  lung]
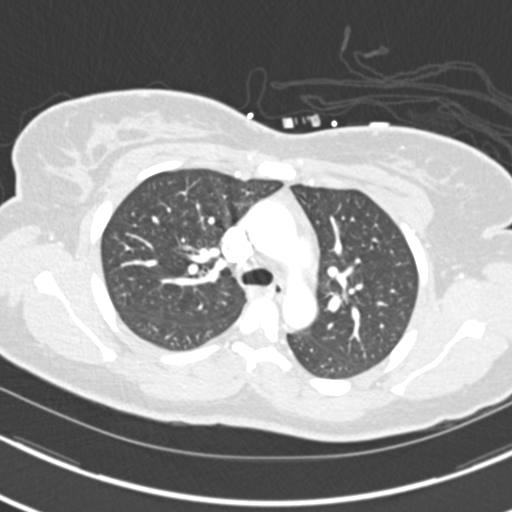
[im 189/242  soft-tissue]
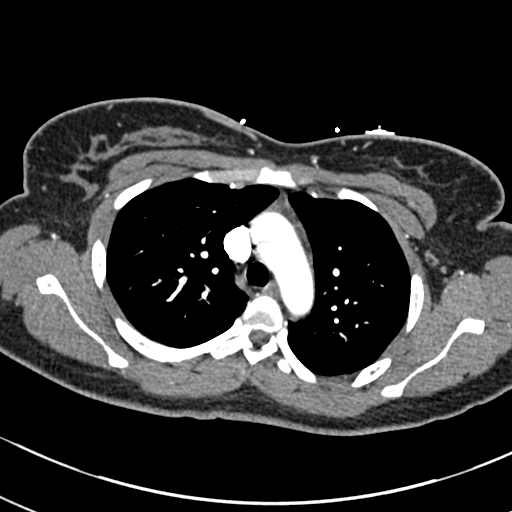
[im 200/242  lung]
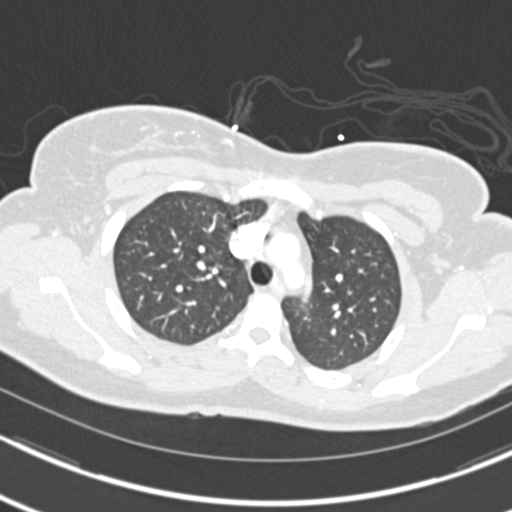
[im 221/242  soft-tissue]
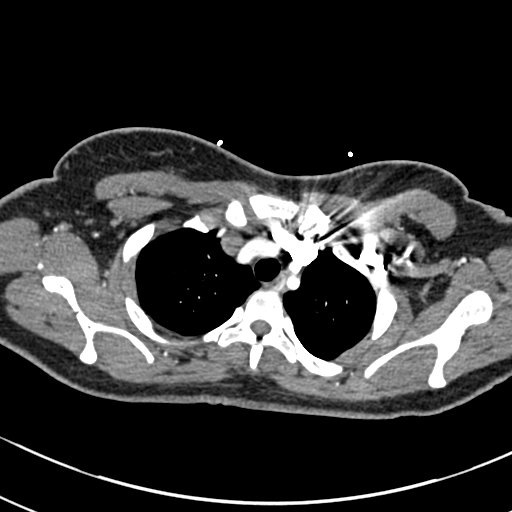
[im 231/242  lung]
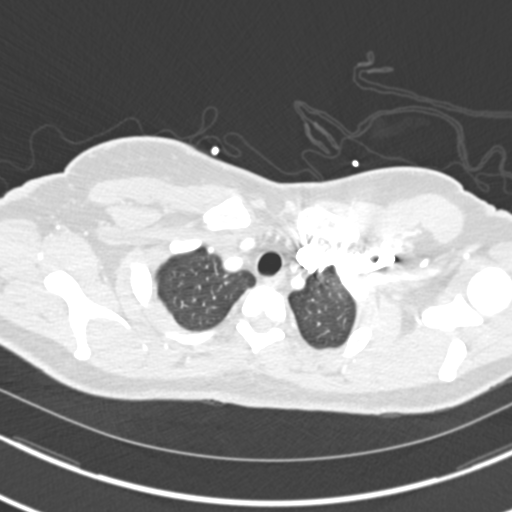

[Series 7: coronal mpr · coronal · 0.49mm/px · 2 of 76 slices shown]
[im 26/76  soft-tissue]
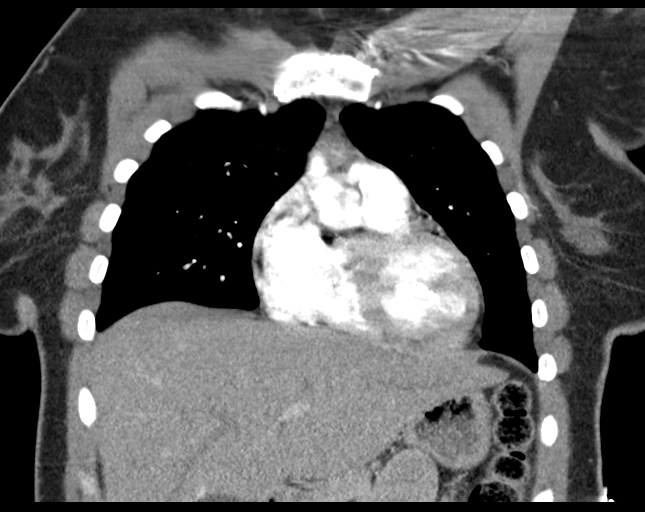
[im 51/76  soft-tissue]
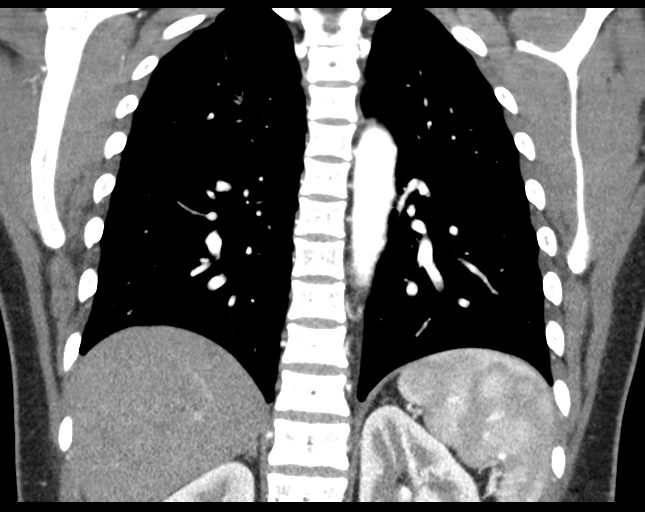

[19 of 46 positions shown; findings below may reference images not displayed]

FINDINGS: Cardiovascular: There is no cardiomegaly or pericardial effusion.
The thoracic aorta is unremarkable. The origins of the great vessels
of the aortic arch appear patent. There is no CT evidence of
pulmonary embolism.

Mediastinum/Nodes: No hilar or mediastinal adenopathy. The esophagus
is grossly unremarkable.

Lungs/Pleura: The lungs are clear. There is no pleural effusion or
pneumothorax. The central airways are patent.

Upper Abdomen: Fatty infiltration of the liver. The visualized upper
abdomen is unremarkable.

Musculoskeletal: No chest wall abnormality. No acute or significant
osseous findings.

Review of the MIP images confirms the above findings.
IMPRESSION: No acute intrathoracic pathology. No CT evidence of pulmonary
embolism.

## 2018-09-12 NOTE — Telephone Encounter (Signed)
09/12/2018 11:47:00 AM - Lantus Solostar  09/12/18 I have received a pharmacy printout for new med-Lantus Solostar Inject 20 units under the skin every day (2), printed [employer] application. [facility], also mailing patient her portion to sign & return, also need current income (Dec 2018-Jan2019) in chart.Forde RadonAJ

## 2018-09-26 ENCOUNTER — Other Ambulatory Visit: Payer: Self-pay

## 2018-09-26 DIAGNOSIS — E119 Type 2 diabetes mellitus without complications: Secondary | ICD-10-CM

## 2018-09-27 LAB — SPECIMEN STATUS

## 2018-09-27 LAB — SPECIMEN STATUS REPORT

## 2018-09-28 LAB — TSH: TSH: 0.841 u[IU]/mL (ref 0.450–4.500)

## 2018-10-02 LAB — CBC
HEMATOCRIT: 33.3 % — AB (ref 34.0–46.6)
Hemoglobin: 10.9 g/dL — ABNORMAL LOW (ref 11.1–15.9)
MCH: 28.8 pg (ref 26.6–33.0)
MCHC: 32.7 g/dL (ref 31.5–35.7)
MCV: 88 fL (ref 79–97)
Platelets: 391 10*3/uL (ref 150–450)
RBC: 3.79 x10E6/uL (ref 3.77–5.28)
RDW: 12.2 % — ABNORMAL LOW (ref 12.3–15.4)
WBC: 6.6 10*3/uL (ref 3.4–10.8)

## 2018-10-02 LAB — URINALYSIS
Bilirubin, UA: NEGATIVE
KETONES UA: NEGATIVE
LEUKOCYTES UA: NEGATIVE
Nitrite, UA: NEGATIVE
PROTEIN UA: NEGATIVE
RBC UA: NEGATIVE
Urobilinogen, Ur: 0.2 mg/dL (ref 0.2–1.0)
pH, UA: 6 (ref 5.0–7.5)

## 2018-10-02 LAB — LIPID PANEL
Chol/HDL Ratio: 4.4 ratio (ref 0.0–4.4)
Cholesterol, Total: 208 mg/dL — ABNORMAL HIGH (ref 100–199)
HDL: 47 mg/dL (ref >39)
LDL Calculated: 140 mg/dL — ABNORMAL HIGH (ref 0–99)
Triglycerides: 103 mg/dL (ref 0–149)
VLDL Cholesterol Cal: 21 mg/dL (ref 5–40)

## 2018-10-02 LAB — SPECIMEN STATUS REPORT

## 2018-10-02 LAB — COMPREHENSIVE METABOLIC PANEL
A/G RATIO: 1.5 (ref 1.2–2.2)
ALBUMIN: 3.8 g/dL (ref 3.5–5.5)
ALK PHOS: 83 IU/L (ref 39–117)
ALT: 7 IU/L (ref 0–32)
AST: 11 IU/L (ref 0–40)
BUN / CREAT RATIO: 14 (ref 9–23)
BUN: 10 mg/dL (ref 6–20)
CHLORIDE: 97 mmol/L (ref 96–106)
CO2: 20 mmol/L (ref 20–29)
Calcium: 9 mg/dL (ref 8.7–10.2)
Creatinine, Ser: 0.71 mg/dL (ref 0.57–1.00)
GFR calc Af Amer: 130 mL/min/{1.73_m2} (ref >59)
GFR calc non Af Amer: 113 mL/min/{1.73_m2} (ref >59)
GLUCOSE: 308 mg/dL — AB (ref 65–99)
Globulin, Total: 2.6 g/dL (ref 1.5–4.5)
Potassium: 4.5 mmol/L (ref 3.5–5.2)
SODIUM: 137 mmol/L (ref 134–144)
Total Protein: 6.4 g/dL (ref 6.0–8.5)

## 2018-10-02 LAB — HEMOGLOBIN A1C
Est. average glucose Bld gHb Est-mCnc: 272 mg/dL
HEMOGLOBIN A1C: 11.1 % — AB (ref 4.8–5.6)

## 2018-10-03 ENCOUNTER — Ambulatory Visit: Payer: Self-pay | Admitting: Internal Medicine

## 2018-10-03 ENCOUNTER — Encounter: Payer: Self-pay | Admitting: Internal Medicine

## 2018-10-03 VITALS — BP 131/91 | HR 90 | Temp 98.6°F | Ht 61.0 in | Wt 142.9 lb

## 2018-10-03 DIAGNOSIS — I10 Essential (primary) hypertension: Secondary | ICD-10-CM

## 2018-10-03 DIAGNOSIS — E119 Type 2 diabetes mellitus without complications: Secondary | ICD-10-CM

## 2018-10-03 LAB — GLUCOSE, POCT (MANUAL RESULT ENTRY): POC Glucose: 224 mg/dl — AB (ref 70–99)

## 2018-10-03 MED ORDER — ATORVASTATIN CALCIUM 20 MG PO TABS: 20.0000 mg | ORAL_TABLET | Freq: Every day | ORAL | 3 refills | Status: DC

## 2018-10-03 NOTE — Progress Notes (Signed)
  Subjective:    Patient ID: Deanna Zimmerman, female    DOB: Nov 09, 1985, 32 y.o.   MRN: [id number]  HPI   Pt is a 32 year old female who was newly diagnosed with diabetes. She states that she isn't sure how to use her insulin pen.   Chief Complaint  Patient presents with  . Follow-up    No specific new problems; need to know of patient's progress with insulin injections     Review of Systems Patient Active Problem List   Diagnosis Date Noted  . Type 2 diabetes mellitus with hyperglycemia, without long-term current use of insulin (HCC) 09/11/2018  . Goiter 02/07/2017  . Hypertension 01/03/2017  . Diabetes mellitus, new onset (HCC) 11/14/2016   Allergies as of 10/03/2018   No Known Allergies     Medication List        Accurate as of 10/03/18  9:52 AM. Always use your most recent med list.          glimepiride 2 MG tablet Commonly known as:  AMARYL Take 0.5-1 tablets (1-2 mg total) by mouth daily with breakfast.   hydrochlorothiazide 12.5 MG capsule Commonly known as:  MICROZIDE TAKE ONE CAPSULE BY MOUTH EVERY DAY   Insulin Glargine 100 UNIT/ML Solostar Pen Commonly known as:  LANTUS Inject 20 Units into the skin daily.   lisinopril 10 MG tablet Commonly known as:  PRINIVIL,ZESTRIL Take 1 tablet (10 mg total) by mouth daily.   metFORMIN 1000 MG (MOD) 24 hr tablet Commonly known as:  GLUMETZA Take 1 tablet (1,000 mg total) by mouth 2 (two) times daily with a meal.   pioglitazone 30 MG tablet Commonly known as:  ACTOS Take 1 tablet (30 mg total) by mouth daily.          Objective:   Physical Exam  BP (!) 131/91   Pulse 90   Temp 98.6 F (37 C) (Oral)   Ht 5\' 1"  (1.549 m)   Wt 142 lb 14.4 oz (64.8 kg)   LMP 08/29/2018   BMI 27.00 kg/m       Assessment & Plan:   1. Diabetes mellitus without complication (HCC)  Pt advised on how to properly use insulin pen. Pt's FSBS this morning is 224, insulin self-administered with direction during  encounter. Pt states she know how to check FSBS.    Needs to lower cholesterol levels. Pt started on Lipitor 20 mg daily (as per the endocrinologist recommended).    Start Today:  - atorvastatin (LIPITOR) 20 MG tablet; Take 1 tablet (20 mg total) by mouth daily.  Dispense: 90 tablet; Refill: 3  Check in 3 Months:  - Comprehensive metabolic panel; Future - CBC; Future - Lipid panel; Future - Hemoglobin A1c; Future  2. Essential hypertension Pt is tolerating medication well. BP is not at target, but no changes are being made today.    Follow up in 3 months with labs a week prior.

## 2018-10-08 ENCOUNTER — Telehealth: Payer: Self-pay | Admitting: Pharmacist

## 2018-10-08 NOTE — Telephone Encounter (Signed)
10/08/2018 12:14:02 PM - Lantus Solostar  10/08/18 Faxed [employer] application for enrollment-Lantus Solostar Inject 20 units daily, 2 boxes.Forde RadonAJ

## 2018-10-16 ENCOUNTER — Encounter: Payer: Self-pay | Admitting: Internal Medicine

## 2018-11-12 ENCOUNTER — Other Ambulatory Visit: Payer: Self-pay | Admitting: Adult Health Nurse Practitioner

## 2018-11-12 DIAGNOSIS — E119 Type 2 diabetes mellitus without complications: Secondary | ICD-10-CM

## 2018-11-13 ENCOUNTER — Ambulatory Visit: Payer: Self-pay | Admitting: Internal Medicine

## 2018-11-13 VITALS — BP 141/93 | HR 102 | Temp 98.2°F | Ht 60.0 in | Wt 150.8 lb

## 2018-11-13 DIAGNOSIS — E119 Type 2 diabetes mellitus without complications: Secondary | ICD-10-CM

## 2018-11-13 LAB — GLUCOSE, POCT (MANUAL RESULT ENTRY): POC Glucose: 69 mg/dl — AB (ref 70–99)

## 2018-11-13 NOTE — Progress Notes (Signed)
Follow up Diabetes/ [facility]     Patient ID: Deanna Zimmerman, female   DOB: [birth year], 33 y.o.   MRN: [id number] Assessment:  Deanna Zimmerman is a 33 y.o. female who is seen in follow up for Diabetes mellitus, new onset (HCC) [E11.9] at the request of Virl Axehaplin, Don C, MD.  Encounter Diagnoses 1. Diabetes mellitus, new onset (HCC)     Assessment   Good improvement since starting lantus 2 months ago. Patient subjectively feels better and BG trend is much better. May be having unrecognized overnight lows given reported symptoms of hand tingling and waking up in the middle of the night. Also reports her bed time sugars are usually higher than waking sugars.   1. T2DM: last A1C 10.8 on 11/19. Due for A1C next month. Continue metformin 1000 BID, glimiperide and pioglitazone.  Advised patient to decrease lantus to 15 units nightly for 1 week to see if hand tingling overnight improves. If no change, go back to 20 units nightly. Expect her A1C to be improved at next check.  2. JYN:WGNFAOZHTN:Started on lisinopril 10 mg last visit. 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   RTC in 3 months.    Plan:        Patient Instructions  Your blood sugar numbers look good and your A1C should be lower when you see your doctor in 1 month.  Decrease lantus to 15 units at night for 1 week to see if you have improvement in the hand tingling at night. If no changes in hand tingling go back up to 20 units. If the tingling goes away stay at 15 units.     Orders Placed This Encounter  Procedures  . POCT Glucose (CBG)     Subjective:  HPI   Review of Systems  Deanna Zimmerman  has a past medical history of Diabetes mellitus without complication (HCC), Goiter (02/07/2017), and Hypertension.   Last visit was started on lantus 20 units nightly given elevated A1C (10.8) and increased piaglitazone to 30 daily. She is on max dose of metformin, glimepiride. She has  tolerated this change very well. She was initially concerned about starting insulin bute feels like it has gone very well. Not missing any doses. Her meter was removed and blood sugars are int th 100-190 range with a few mid 200s. She states morning sugars are usually 100-150. She is complaining of hand tingling in the middle of the night. No symptoms of low blood sugars noted at these times. She will check her sugar at these times atnd usually around 90-150.   No low blood sugars reports. No polyuria or polydipsia. No nausea or vomiting. A1C not checked today. To be checked at PCP next month.   Family History, Social History, current Medications and allergies reviewed and updated in Epic.  Objective:    Blood pressure (!) 141/93, pulse (!) 102, temperature 98.2 F (36.8 C), temperature source Oral, height 5' (1.524 m), weight 150 lb 12.8 oz (68.4 kg), last menstrual period 11/08/2018. Physical Exam    GEN: well appearing female, NAD CV: Tachycardic, reg rate, no murmur PULM. Normal WOB on room air, no crackles or wheezing noted.  Foot: exam performed last visit.   Data : I have personally reviewed pertinent labs and imaging studies, if indicated,  with the patient in clinic today.    Lab Orders     POCT Glucose (CBG)  HC Readings from Last 3 Encounters:  No data found for [facility]    Wt Readings from Last 3 Encounters:  11/13/18 150 lb 12.8 oz (68.4 kg)  10/03/18 142 lb 14.4 oz (64.8 kg)  09/11/18 144 lb 6.4 oz (65.5 kg)

## 2018-11-13 NOTE — Patient Instructions (Signed)
Your blood sugar numbers look good and your A1C should be lower when you see your doctor in 1 month.  Decrease lantus to 15 units at night for 1 week to see if you have improvement in the hand tingling at night. If no changes in hand tingling go back up to 20 units. If the tingling goes away stay at 15 units.

## 2018-11-29 ENCOUNTER — Telehealth: Payer: Self-pay | Admitting: Pharmacist

## 2018-11-29 NOTE — Telephone Encounter (Signed)
11/29/2018 2:04:59 PM - Lantus Solostar refill  11/29/2018 Printed [employer] refill request for Lantus Solostar Inject 20 units under the skin every day, taking to [facility] for Teah to sign.Forde Radon

## 2018-12-07 ENCOUNTER — Other Ambulatory Visit: Payer: Self-pay

## 2018-12-07 DIAGNOSIS — Z5321 Procedure and treatment not carried out due to patient leaving prior to being seen by health care provider: Secondary | ICD-10-CM | POA: Insufficient documentation

## 2018-12-07 DIAGNOSIS — R0981 Nasal congestion: Secondary | ICD-10-CM | POA: Insufficient documentation

## 2018-12-07 NOTE — ED Triage Notes (Signed)
Pt states cough and congestion for several days. Pt states now has had left ear pain for 2 days. Pt appears in no acute distress. Pt denies known fever.

## 2018-12-08 ENCOUNTER — Encounter: Payer: Self-pay | Admitting: Gynecology

## 2018-12-08 ENCOUNTER — Emergency Department
Admission: EM | Admit: 2018-12-08 | Discharge: 2018-12-08 | Discharge status: Against Medical Advice | Payer: Self-pay | Attending: Emergency Medicine | Admitting: Emergency Medicine

## 2018-12-08 ENCOUNTER — Ambulatory Visit
Admission: EM | Admit: 2018-12-08 | Discharge: 2018-12-08 | Disposition: A | Discharge status: Home / Self Care | Payer: Self-pay | Attending: Emergency Medicine | Admitting: Emergency Medicine

## 2018-12-08 ENCOUNTER — Other Ambulatory Visit: Payer: Self-pay

## 2018-12-08 DIAGNOSIS — J069 Acute upper respiratory infection, unspecified: Secondary | ICD-10-CM

## 2018-12-08 DIAGNOSIS — I10 Essential (primary) hypertension: Secondary | ICD-10-CM

## 2018-12-08 MED ORDER — HYDROCOD POLST-CPM POLST ER 10-8 MG/5ML PO SUER: 5.0000 mL | Freq: Two times a day (BID) | ORAL | 0 refills | Status: DC

## 2018-12-08 MED ORDER — BENZONATATE 200 MG PO CAPS: ORAL_CAPSULE | ORAL | 0 refills | Status: DC

## 2018-12-08 MED ORDER — FLUTICASONE PROPIONATE 50 MCG/ACT NA SUSP: 2.0000 | Freq: Every day | NASAL | 0 refills | Status: DC

## 2018-12-08 NOTE — Discharge Instructions (Addendum)
Drink plenty of fluids.  Rest as much as possible.  Use Tylenol or Motrin for fever chills or body aches.  °

## 2018-12-08 NOTE — ED Provider Notes (Signed)
[facility]    CSN: [id number] Arrival date & time: 12/08/18  0825     History   Chief Complaint Chief Complaint  Patient presents with  . Otalgia  . Cough    HPI Deanna Zimmerman is a 33 y.o. female.   HPI  Female presents with cough and left ear pain.  States that this started about a week ago with cold symptoms mostly upper respiratory.  That has gotten somewhat better but now is left with the cough which is productive and keeping her up at night.  She also has left ear pain.  She went to [facility] emergency room last night but the left early because it was taking too long to be seen.  She has had no fever.        Past Medical History:  Diagnosis Date  . Diabetes mellitus without complication (HCC)   . Goiter 02/07/2017  . Hypertension     Patient Active Problem List   Diagnosis Date Noted  . Type 2 diabetes mellitus with hyperglycemia, without long-term current use of insulin (HCC) 09/11/2018  . Goiter 02/07/2017  . Hypertension 01/03/2017  . Diabetes mellitus, new onset (HCC) 11/14/2016    Past Surgical History:  Procedure Laterality Date  . CESAREAN SECTION    . FRACTURE SURGERY      OB History   No obstetric history on file.      Home Medications    Prior to Admission medications   Medication Sig Start Date End Date Taking? Authorizing Provider  atorvastatin (LIPITOR) 20 MG tablet Take 1 tablet (20 mg total) by mouth daily. 10/03/18  Yes Virl Axe, MD  glimepiride (AMARYL) 2 MG tablet Take 0.5-1 tablets (1-2 mg total) by mouth daily with breakfast. Patient taking differently: Take 2 mg by mouth daily with breakfast.  11/23/17  Yes Virl Axe, MD  hydrochlorothiazide (MICROZIDE) 12.5 MG capsule TAKE ONE CAPSULE BY MOUTH EVERY DAY 09/11/18  Yes Virl Axe, MD  Insulin Glargine (LANTUS SOLOSTAR) 100 UNIT/ML Solostar Pen Inject 20 Units into the skin daily. 09/11/18  Yes Dhir, Irving Burton, MD  lisinopril (PRINIVIL,ZESTRIL) 10 MG tablet  Take 1 tablet (10 mg total) by mouth daily. 09/11/18  Yes Dhir, Irving Burton, MD  pioglitazone (ACTOS) 15 MG tablet TAKE ONE TABLET BY MOUTH EVERY DAY 11/14/18  Yes Virl Axe, MD  pioglitazone (ACTOS) 30 MG tablet Take 1 tablet (30 mg total) by mouth daily. 09/11/18  Yes Dhir, Irving Burton, MD  benzonatate (TESSALON) 200 MG capsule Take one cap TID PRN cough 12/08/18   Lutricia Feil, PA-C  chlorpheniramine-HYDROcodone [facility]) 10-8 MG/5ML SUER Take 5 mLs by mouth 2 (two) times daily. 12/08/18   Lutricia Feil, PA-C  fluticasone (FLONASE) 50 MCG/ACT nasal spray Place 2 sprays into both nostrils daily. 12/08/18   Lutricia Feil, PA-C  metFORMIN (GLUMETZA) 1000 MG (MOD) 24 hr tablet Take 1 tablet (1,000 mg total) by mouth 2 (two) times daily with a meal. 07/10/18 07/10/19  Josetta Huddle, FNP    Family History Family History  Problem Relation Age of Onset  . Diabetes Sister     Social History Social History   Tobacco Use  . Smoking status: Never Smoker  . Smokeless tobacco: Never Used  Substance Use Topics  . Alcohol use: Never    Frequency: Never  . Drug use: Never     Allergies   Patient has no known allergies.   Review of Systems Review  of Systems  Constitutional: Positive for activity change. Negative for appetite change, chills, fatigue and fever.  HENT: Positive for congestion, ear pain and postnasal drip. Negative for ear discharge.   Respiratory: Positive for cough.   All other systems reviewed and are negative.    Physical Exam Triage Vital Signs ED Triage Vitals  Enc Vitals Group     BP 12/08/18 0856 (!) 143/97     Pulse Rate 12/08/18 0856 94     Resp 12/08/18 0856 16     Temp 12/08/18 0856 98.7 F (37.1 C)     Temp Source 12/08/18 0856 Oral     SpO2 12/08/18 0856 100 %     Weight 12/08/18 0857 150 lb (68 kg)     Height --      Head Circumference --      Peak Flow --      Pain Score 12/08/18 0855 7     Pain Loc --      Pain Edu? --        Excl. in GC? --    No data found.  Updated Vital Signs BP (!) 143/97   Pulse 94   Temp 98.7 F (37.1 C) (Oral)   Resp 16   Wt 150 lb (68 kg)   LMP 11/08/2018 (Exact Date)   SpO2 100%   BMI 29.29 kg/m   Visual Acuity Right Eye Distance:   Left Eye Distance:   Bilateral Distance:    Right Eye Near:   Left Eye Near:    Bilateral Near:     Physical Exam Vitals signs and nursing note reviewed.  Constitutional:      General: She is not in acute distress.    Appearance: Normal appearance. She is not ill-appearing, toxic-appearing or diaphoretic.  HENT:     Head: Normocephalic and atraumatic.     Right Ear: Tympanic membrane, ear canal and external ear normal.     Left Ear: Tympanic membrane and ear canal normal.     Ears:     Comments: Left ear shows effusion present.    Nose: Congestion and rhinorrhea present.     Mouth/Throat:     Mouth: Mucous membranes are moist.     Pharynx: Oropharynx is clear. No oropharyngeal exudate or posterior oropharyngeal erythema.  Eyes:     General:        Right eye: No discharge.        Left eye: No discharge.     Conjunctiva/sclera: Conjunctivae normal.  Neck:     Musculoskeletal: Normal range of motion and neck supple.  Pulmonary:     Effort: Pulmonary effort is normal.     Breath sounds: Normal breath sounds.  Musculoskeletal: Normal range of motion.  Lymphadenopathy:     Cervical: No cervical adenopathy.  Skin:    General: Skin is warm and dry.  Neurological:     General: No focal deficit present.     Mental Status: She is alert and oriented to person, place, and time.  Psychiatric:        Mood and Affect: Mood normal.        Behavior: Behavior normal.        Thought Content: Thought content normal.        Judgment: Judgment normal.      UC Treatments / Results  Labs (all labs ordered are listed, but only abnormal results are displayed) Labs Reviewed - No data to display  EKG None  Radiology No results  found.  Procedures Procedures (including critical care time)  Medications Ordered in UC Medications - No data to display  Initial Impression / Assessment and Plan / UC Course  I have reviewed the triage vital signs and the nursing notes.  Pertinent labs & imaging results that were available during my care of the patient were reviewed by me and considered in my medical decision making (see chart for details).   Patient has upper respiratory infection that has not completely improved although she is showing improvement already.  Treat the left ear effusion which is likely eustachian tube dysfunction with Flonase nasal spray.  She will also be treated with cough suppressants with appropriate precautions.  Should use Tylenol Or Motrin for body aches etc. she will follow-up with her primary care physician if she is not improving.   Final Clinical Impressions(s) / UC Diagnoses   Final diagnoses:  Upper respiratory tract infection, unspecified type     Discharge Instructions     Drink plenty of fluids.  Rest as much as possible.  Use Tylenol or Motrin for fever chills or body aches.     ED Prescriptions    Medication Sig Dispense Auth. Provider   fluticasone (FLONASE) 50 MCG/ACT nasal spray Place 2 sprays into both nostrils daily. 16 g Ovid Curd P, PA-C   benzonatate (TESSALON) 200 MG capsule Take one cap TID PRN cough 30 capsule Lutricia Feil, PA-C   chlorpheniramine-HYDROcodone (TUSSIONEX PENNKINETIC ER) 10-8 MG/5ML SUER Take 5 mLs by mouth 2 (two) times daily. 115 mL Lutricia Feil, PA-C     Controlled Substance Prescriptions  Controlled Substance Registry consulted? Not Applicable   Lutricia Feil, PA-C 12/08/18 1001

## 2018-12-08 NOTE — ED Notes (Signed)
No answer when called x 2

## 2018-12-08 NOTE — ED Notes (Signed)
No answer when called 

## 2018-12-08 NOTE — ED Triage Notes (Signed)
Patient c/o cough and left ear pain. Per patient was at [facility] ER last night and left because it was taking too long to been seen.

## 2018-12-12 ENCOUNTER — Telehealth: Payer: Self-pay | Admitting: Pharmacist

## 2018-12-12 NOTE — Telephone Encounter (Signed)
12/12/2018 12:34:40 PM - lantus solostar refill  12/12/2018 Faxed [employer] refill request for Lantus Solostar Inject 20 units under the skin every day #1.Forde Radon

## 2018-12-13 ENCOUNTER — Other Ambulatory Visit: Payer: Self-pay | Admitting: Internal Medicine

## 2018-12-26 ENCOUNTER — Other Ambulatory Visit: Payer: Self-pay

## 2018-12-26 DIAGNOSIS — E119 Type 2 diabetes mellitus without complications: Secondary | ICD-10-CM

## 2018-12-27 LAB — COMPREHENSIVE METABOLIC PANEL
A/G RATIO: 1.4 (ref 1.2–2.2)
ALT: 11 IU/L (ref 0–32)
AST: 15 IU/L (ref 0–40)
Albumin: 4.3 g/dL (ref 3.8–4.8)
Alkaline Phosphatase: 100 IU/L (ref 39–117)
BUN/Creatinine Ratio: 17 (ref 9–23)
BUN: 11 mg/dL (ref 6–20)
Bilirubin Total: 0.2 mg/dL (ref 0.0–1.2)
CALCIUM: 9.7 mg/dL (ref 8.7–10.2)
CO2: 27 mmol/L (ref 20–29)
Chloride: 95 mmol/L — ABNORMAL LOW (ref 96–106)
Creatinine, Ser: 0.66 mg/dL (ref 0.57–1.00)
GFR calc Af Amer: 134 mL/min/{1.73_m2} (ref >59)
GFR calc non Af Amer: 116 mL/min/{1.73_m2} (ref >59)
Globulin, Total: 3 g/dL (ref 1.5–4.5)
Glucose: 287 mg/dL — ABNORMAL HIGH (ref 65–99)
Potassium: 4.5 mmol/L (ref 3.5–5.2)
Sodium: 133 mmol/L — ABNORMAL LOW (ref 134–144)
Total Protein: 7.3 g/dL (ref 6.0–8.5)

## 2018-12-27 LAB — HEMOGLOBIN A1C
Est. average glucose Bld gHb Est-mCnc: 200 mg/dL
Hgb A1c MFr Bld: 8.6 % — ABNORMAL HIGH (ref 4.8–5.6)

## 2018-12-27 LAB — CBC
Hematocrit: 35.7 % (ref 34.0–46.6)
Hemoglobin: 11.5 g/dL (ref 11.1–15.9)
MCH: 28.5 pg (ref 26.6–33.0)
MCHC: 32.2 g/dL (ref 31.5–35.7)
MCV: 89 fL (ref 79–97)
Platelets: 377 10*3/uL (ref 150–450)
RBC: 4.03 x10E6/uL (ref 3.77–5.28)
RDW: 12.4 % (ref 11.7–15.4)
WBC: 7.8 10*3/uL (ref 3.4–10.8)

## 2018-12-27 LAB — LIPID PANEL
Chol/HDL Ratio: 3 ratio (ref 0.0–4.4)
Cholesterol, Total: 139 mg/dL (ref 100–199)
HDL: 47 mg/dL (ref >39)
LDL Calculated: 83 mg/dL (ref 0–99)
Triglycerides: 45 mg/dL (ref 0–149)
VLDL CHOLESTEROL CAL: 9 mg/dL (ref 5–40)

## 2019-01-02 ENCOUNTER — Ambulatory Visit: Payer: Self-pay | Admitting: Internal Medicine

## 2019-01-09 ENCOUNTER — Ambulatory Visit: Payer: Self-pay | Admitting: Internal Medicine

## 2019-01-09 ENCOUNTER — Other Ambulatory Visit: Payer: Self-pay

## 2019-01-09 ENCOUNTER — Encounter: Payer: Self-pay | Admitting: Internal Medicine

## 2019-01-09 VITALS — BP 132/96 | HR 96 | Ht 60.0 in | Wt 150.0 lb

## 2019-01-09 DIAGNOSIS — E119 Type 2 diabetes mellitus without complications: Secondary | ICD-10-CM

## 2019-01-09 DIAGNOSIS — I10 Essential (primary) hypertension: Secondary | ICD-10-CM

## 2019-01-09 MED ORDER — LOSARTAN POTASSIUM 100 MG PO TABS: 100.0000 mg | ORAL_TABLET | Freq: Every day | ORAL | 3 refills | Status: DC

## 2019-01-09 NOTE — Progress Notes (Signed)
   Subjective:    Patient ID: [name], female    DOB: 09/25/1986, 33 y.o.   MRN: [id number]  HPI   Patient is a 33 year old female who presents for a follow up with diabetes and hypertension. Patient reports checking FSBS when she feels like her sugar is dropping.   Chief Complaint  Patient presents with  . Follow-up    diabetes    Review of Systems Patient Active Problem List   Diagnosis Date Noted  . Type 2 diabetes mellitus with hyperglycemia, without long-term current use of insulin (HCC) 09/11/2018  . Goiter 02/07/2017  . Hypertension 01/03/2017  . Diabetes mellitus, new onset (HCC) 11/14/2016   Allergies as of 01/09/2019   No Known Allergies     Medication List       Accurate as of January 09, 2019 10:57 AM. Always use your most recent med list.        glimepiride 2 MG tablet Commonly known as:  Amaryl Take 0.5-1 tablets (1-2 mg total) by mouth daily with breakfast.   hydrochlorothiazide 12.5 MG capsule Commonly known as:  MICROZIDE TAKE ONE CAPSULE BY MOUTH EVERY DAY   Insulin Glargine 100 UNIT/ML Solostar Pen Commonly known as:  Lantus SoloStar Inject 20 Units into the skin daily.   lisinopril 10 MG tablet Commonly known as:  PRINIVIL,ZESTRIL TAKE ONE TABLET BY MOUTH EVERY DAY   metFORMIN 1000 MG (MOD) 24 hr tablet Commonly known as:  GLUMETZA Take 1 tablet (1,000 mg total) by mouth 2 (two) times daily with a meal.   pioglitazone 30 MG tablet Commonly known as:  Actos Take 1 tablet (30 mg total) by mouth daily.         Objective:   Physical Exam  BP (!) 132/96 (BP Location: Left Arm, Patient Position: Sitting)   Pulse 96   Ht 5' (1.524 m)   Wt 150 lb (68 kg)   SpO2 100%   BMI 29.29 kg/m      Assessment & Plan:   1. Essential hypertension Not quite at target. Switching Lisinopril to Losartan. Pt advised to D/C Lisinopril once she receives Losartan.   START TODAY: - losartan (COZAAR) 100 MG tablet; Take 1 tablet (100 mg total)  by mouth daily.  Dispense: 90 tablet; Refill: 3  Check in 3 Months:  - Comprehensive metabolic panel; Future  2. Diabetes mellitus, new onset [facility]) Patient has follow up with [facility] in April 2020.   Patient advised to start checking FSBS 3 times a week in the morning when she wakes to track her blood sugars.   Pt has been taking Actos 30 mg and 15 mg daily for a total dose of 45 mg daily. Pt advised to D/C 15 mg Actos and should only be taking 30 mg Actos daily.  Check in 3 Months:  - Hemoglobin A1c; Future   RTE in 3 months with labs a week prior.

## 2019-01-23 ENCOUNTER — Telehealth: Payer: Self-pay | Admitting: Pharmacy Technician

## 2019-01-23 NOTE — Telephone Encounter (Signed)
Received 2020 proof of income.  Patient eligible to receive medication assistance at [facility] as long as eligibility requirements continue to be met.  [facility]

## 2019-01-28 ENCOUNTER — Telehealth: Payer: Self-pay | Admitting: Pharmacist

## 2019-01-28 NOTE — Telephone Encounter (Signed)
01/28/2019 9:56:58 AM - Lantus Solostar refill  01/28/2019 Sending [employer] refill request for Lantus Solostar Inject 20 units under the skin every day #1 for provider to sign.Forde Radon

## 2019-02-12 ENCOUNTER — Ambulatory Visit: Payer: Self-pay

## 2019-03-01 ENCOUNTER — Telehealth: Payer: Self-pay | Admitting: Pharmacist

## 2019-03-01 NOTE — Telephone Encounter (Signed)
03/01/2019 9:13:16 AM - Lantus Solostar refill  03/01/2019 Faxed [employer] refill request for Lantus Solostar Inject 20 units under the skin everyday #1.Forde Radon

## 2019-04-03 ENCOUNTER — Other Ambulatory Visit: Payer: Self-pay

## 2019-04-04 ENCOUNTER — Other Ambulatory Visit: Payer: Self-pay

## 2019-04-09 ENCOUNTER — Ambulatory Visit: Payer: Self-pay

## 2019-04-09 ENCOUNTER — Other Ambulatory Visit: Payer: Self-pay

## 2019-04-09 NOTE — Progress Notes (Incomplete)
Follow up Diabetes/ Endocrine [facility]     Patient ID: Deanna Zimmerman, female   DOB: 01-Jul-1986, 33 y.o.   MRN: [id number] Assessment:  Deanna Zimmerman is a 33 y.o. female who is seen in follow up for No primary diagnosis found. at the request of Tawni Millers, MD.  Encounter Diagnoses No diagnosis found.  Assessment  1. Diabetes mellitus:   - Microalbumin/Cr ratio due  - Foot exam due 08/2019  HbA1c 8.6 on 12/26/2018 - will need another HbA1c 12.6 microalbumin urine on 06/06/2018 No note on diabetic retinopathy screening    Plan:        There are no Patient Instructions on file for this visit.   No orders of the defined types were placed in this encounter.    Subjective:  Diabetes     Med regimen: glimepiride 0.5-1 tablet daily, lantus 15u , metformin ER 1000mg  daily, pioglitazone (actos) 30mg  tablet   Review of Systems  Deanna Zimmerman  has a past medical history of Diabetes mellitus without complication ([place]), Goiter (02/07/2017), and Hypertension.  Family History, Social History, current Medications and allergies reviewed and updated in Epic.  Objective:    There were no vitals taken for this visit. Physical Exam      Data : I have personally reviewed pertinent labs and imaging studies, if indicated,  with the patient in clinic today.   Lab Orders  No laboratory test(s) ordered today    HC Readings from Last 3 Encounters:  No data found for [facility]    Wt Readings from Last 3 Encounters:  01/09/19 150 lb (68 kg)  12/08/18 150 lb (68 kg)  12/07/18 150 lb (68 kg)

## 2019-04-10 ENCOUNTER — Ambulatory Visit: Payer: Self-pay | Admitting: Internal Medicine

## 2019-04-10 ENCOUNTER — Other Ambulatory Visit: Payer: Self-pay

## 2019-04-10 DIAGNOSIS — I10 Essential (primary) hypertension: Secondary | ICD-10-CM

## 2019-04-10 DIAGNOSIS — E119 Type 2 diabetes mellitus without complications: Secondary | ICD-10-CM

## 2019-04-11 LAB — COMPREHENSIVE METABOLIC PANEL
ALT: 12 IU/L (ref 0–32)
AST: 16 IU/L (ref 0–40)
Albumin/Globulin Ratio: 1.6 (ref 1.2–2.2)
Albumin: 4.3 g/dL (ref 3.8–4.8)
Alkaline Phosphatase: 78 IU/L (ref 39–117)
BUN/Creatinine Ratio: 13 (ref 9–23)
BUN: 10 mg/dL (ref 6–20)
Bilirubin Total: <0.2 mg/dL (ref 0.0–1.2)
CO2: 24 mmol/L (ref 20–29)
Calcium: 9.3 mg/dL (ref 8.7–10.2)
Chloride: 102 mmol/L (ref 96–106)
Creatinine, Ser: 0.77 mg/dL (ref 0.57–1.00)
GFR calc Af Amer: 117 mL/min/{1.73_m2} (ref >59)
GFR calc non Af Amer: 102 mL/min/{1.73_m2} (ref >59)
Globulin, Total: 2.7 g/dL (ref 1.5–4.5)
Glucose: 251 mg/dL — ABNORMAL HIGH (ref 65–99)
Potassium: 3.9 mmol/L (ref 3.5–5.2)
Sodium: 138 mmol/L (ref 134–144)
Total Protein: 7 g/dL (ref 6.0–8.5)

## 2019-04-11 LAB — HEMOGLOBIN A1C
Est. average glucose Bld gHb Est-mCnc: 206 mg/dL
Hgb A1c MFr Bld: 8.8 % — ABNORMAL HIGH (ref 4.8–5.6)

## 2019-04-17 ENCOUNTER — Ambulatory Visit: Payer: Self-pay | Admitting: Internal Medicine

## 2019-04-24 ENCOUNTER — Ambulatory Visit: Payer: Self-pay | Admitting: Internal Medicine

## 2019-04-24 ENCOUNTER — Other Ambulatory Visit: Payer: Self-pay

## 2019-04-24 ENCOUNTER — Encounter: Payer: Self-pay | Admitting: Internal Medicine

## 2019-04-24 DIAGNOSIS — E119 Type 2 diabetes mellitus without complications: Secondary | ICD-10-CM

## 2019-04-24 MED ORDER — PIOGLITAZONE HCL 30 MG PO TABS: 15.0000 mg | ORAL_TABLET | Freq: Every day | ORAL | 3 refills | Status: DC

## 2019-04-24 NOTE — Progress Notes (Signed)
   Subjective:    Patient ID: Deanna Zimmerman, female    DOB: 04/08/1986, 33 y.o.   MRN: [id number]  HPI  Telephonic visit.  Patient reports that blood glucose has been doing well.   Review of Systems     Patient Active Problem List   Diagnosis Date Noted  . Type 2 diabetes mellitus with hyperglycemia, without long-term current use of insulin ([place]) 09/11/2018  . Goiter 02/07/2017  . Hypertension 01/03/2017  . Diabetes mellitus, new onset ([place]) 11/14/2016   Allergies as of 04/24/2019   No Known Allergies     Medication List       Accurate as of April 24, 2019 10:49 AM. If you have any questions, ask your nurse or doctor.        glimepiride 2 MG tablet Commonly known as: Amaryl Take 0.5-1 tablets (1-2 mg total) by mouth daily with breakfast. What changed: how much to take   hydrochlorothiazide 12.5 MG capsule Commonly known as: MICROZIDE TAKE ONE CAPSULE BY MOUTH EVERY DAY   Insulin Glargine 100 UNIT/ML Solostar Pen Commonly known as: Lantus SoloStar Inject 20 Units into the skin daily.   losartan 100 MG tablet Commonly known as: COZAAR Take 1 tablet (100 mg total) by mouth daily.   metFORMIN 1000 MG (MOD) 24 hr tablet Commonly known as: GLUMETZA Take 1 tablet (1,000 mg total) by mouth 2 (two) times daily with a meal.   pioglitazone 30 MG tablet Commonly known as: Actos Take 0.5 tablets (15 mg total) by mouth daily. What changed: how much to take Changed by: Tawni Millers, MD       Objective:   Physical Exam  None     Assessment & Plan:   Diabetes: Patient reports sugars staying in the 150s, except for one reading in the 60s, asymptomatic. Plan is to reduce Actos to 15mg  a day. Continue sugars check 3 times a week at least, before breakfast. A1C repeated in 4 weeks with me by phone.

## 2019-04-30 ENCOUNTER — Telehealth: Payer: Self-pay | Admitting: Pharmacist

## 2019-04-30 NOTE — Telephone Encounter (Signed)
04/30/2019 3:16:54 PM - Lantus Solostar refill to dr  04/30/2019 Taking [employer] refill request for Lantus Solostar Inject 20 units every day to [facility] for provider to sign & return.Delos Haring

## 2019-05-09 ENCOUNTER — Other Ambulatory Visit: Payer: Self-pay

## 2019-05-09 MED ORDER — METFORMIN HCL 1000 MG PO TABS: 1000.0000 mg | ORAL_TABLET | Freq: Two times a day (BID) | ORAL | 2 refills | Status: DC

## 2019-05-17 ENCOUNTER — Telehealth: Payer: Self-pay | Admitting: Pharmacist

## 2019-05-17 NOTE — Telephone Encounter (Signed)
05/17/2019 12:18:59 PM - Lantus Solostar refill to [facility]  05/17/2019 Faxed [employer] refill for Lantus Solostar Inject 20 units under the skin every day #1.Delos Haring

## 2019-05-29 ENCOUNTER — Other Ambulatory Visit: Payer: Self-pay

## 2019-05-29 DIAGNOSIS — E119 Type 2 diabetes mellitus without complications: Secondary | ICD-10-CM

## 2019-05-30 LAB — HEMOGLOBIN A1C
Est. average glucose Bld gHb Est-mCnc: 174 mg/dL
Hgb A1c MFr Bld: 7.7 % — ABNORMAL HIGH (ref 4.8–5.6)

## 2019-06-05 ENCOUNTER — Ambulatory Visit: Payer: Self-pay | Admitting: Internal Medicine

## 2019-06-12 ENCOUNTER — Ambulatory Visit: Payer: Self-pay | Admitting: Internal Medicine

## 2019-06-12 ENCOUNTER — Other Ambulatory Visit: Payer: Self-pay

## 2019-06-12 DIAGNOSIS — E119 Type 2 diabetes mellitus without complications: Secondary | ICD-10-CM

## 2019-06-12 NOTE — Progress Notes (Signed)
   Subjective:    Patient ID: Deanna Zimmerman, female    DOB: Apr 05, 1986, 33 y.o.   MRN: [id number]  HPI  Patient is a 33 year old female presenting for a telephonic visit.  Review of Systems Patient Active Problem List   Diagnosis Date Noted  . Type 2 diabetes mellitus with hyperglycemia, without long-term current use of insulin ([place]) 09/11/2018  . Goiter 02/07/2017  . Hypertension 01/03/2017  . Diabetes mellitus, new onset ([place]) 11/14/2016    Allergies as of 06/12/2019   No Known Allergies     Medication List       Accurate as of June 12, 2019 10:35 AM. If you have any questions, ask your nurse or doctor.        glimepiride 2 MG tablet Commonly known as: Amaryl Take 0.5-1 tablets (1-2 mg total) by mouth daily with breakfast. What changed: how much to take   hydrochlorothiazide 12.5 MG capsule Commonly known as: MICROZIDE TAKE ONE CAPSULE BY MOUTH EVERY DAY   Insulin Glargine 100 UNIT/ML Solostar Pen Commonly known as: Lantus SoloStar Inject 20 Units into the skin daily.   losartan 100 MG tablet Commonly known as: COZAAR Take 1 tablet (100 mg total) by mouth daily.   metFORMIN 1000 MG tablet Commonly known as: Glucophage Take 1 tablet (1,000 mg total) by mouth 2 (two) times daily with a meal.   pioglitazone 30 MG tablet Commonly known as: Actos Take 0.5 tablets (15 mg total) by mouth daily.          Objective:   Physical Exam   No PE - Telephonic visit     Assessment & Plan:   1. Diabetes On last visit we reduced her Actos from '30mg'$  to '15mg'$ . Pt reports she feels well and blood sugars have been in the 90s, occasionally 98 but nothing lower. No low blood sugar symptoms reported.  A1C is improved down to 7.7 from 8.8 on previous reading. Asked pt to check blood sugars alternating before breakfast and before supper. Pt was instructed to report to [place] if she has readings under 70.  Plan to recheck pt in 8 weeks with FU labs a week prior.  Labs  ordered: - Met C - a1c - Lipid panel

## 2019-06-27 ENCOUNTER — Other Ambulatory Visit: Payer: Self-pay

## 2019-06-27 DIAGNOSIS — E119 Type 2 diabetes mellitus without complications: Secondary | ICD-10-CM

## 2019-06-27 MED ORDER — HYDROCHLOROTHIAZIDE 12.5 MG PO CAPS: 12.5000 mg | ORAL_CAPSULE | Freq: Every day | ORAL | 0 refills | Status: DC

## 2019-07-09 ENCOUNTER — Ambulatory Visit: Payer: Self-pay | Admitting: Student

## 2019-07-09 ENCOUNTER — Other Ambulatory Visit: Payer: Self-pay

## 2019-07-09 DIAGNOSIS — E1165 Type 2 diabetes mellitus with hyperglycemia: Secondary | ICD-10-CM

## 2019-07-09 NOTE — Patient Instructions (Signed)
Continue with metformin 1000mg  twice a day.   For family planning, it is best to try to get blood sugars as well controlled and normal as possible. Increase to 18units of insulin (Lantus) at nighttime once a day.Try to get morning blood sugars between 90-130 with a goal to control blood sugars with a HbA1c less than 6.0%. If you have symptoms of shaking, chills, passing out, or having blood sugars less than 60, seek medical attention with your primary care provider.

## 2019-07-09 NOTE — Progress Notes (Signed)
Follow up Diabetes/ Endocrine [facility]     Patient ID: Deanna Zimmerman, female   DOB: 09/28/1986, 33 y.o.   MRN: [id number] Assessment:  Deanna Zimmerman is a 33 y.o. female who is seen in follow up for No primary diagnosis found. at the request of Virl Axehaplin, Don C, MD.  Encounter Diagnoses No diagnosis found.  Assessment/Plan:  1. Diabetes mellitus Patient seems to have well-controlled sugars with home BG readings of 130s-140s in the mornings, with no higher than 150. No symptoms of hypoglycemia reported. No concerning symptoms for diabetes complications. Patient has expressed a desire to be pregnant and have a family in the future, which indicates a need for tighter glycemic control with HbA1c <6.0%. - Patient advised for the need of tighter glycemic control for family planning purposes. Instructed on insulin 18u daily with continuation of metformin (as below).  - HbA1c due Nov 2020 - Patient scheduled for DR screening in Nov 2020 - [facility]     Patient Instructions  Continue with metformin 1000mg  twice a day.   For family planning, it is best to try to get blood sugars as well controlled and normal as possible. Increase to 18units of insulin (Lantus) at nighttime once a day.Try to get morning blood sugars between 90-130 with a goal to control blood sugars with a HbA1c less than 6.0%. If you have symptoms of shaking, chills, passing out, or having blood sugars less than 60, seek medical attention with your primary care provider.    No orders of the defined types were placed in this encounter.    Subjective:  CC: diabetes f/u HPI: Deanna Zimmerman is a 33 yo woman with PMHx of diabetes, HTN who presents to clinic for diabetes f/u. Her current medication regimen for diabetes includes metformin 1000mg  BID, insulin glargine 15u at night 1x daily. She never received a prescription for ACTOS so has not been taking ACTOS. She is not currently taking glimepiride. Her blood  glucose readings at home (breakfast and dinner, 3x a week) have ranged from 130s-140s in the morning prior to any food intake and 140s-160s. She denies any shaking, chills, or feeling of passing out. Patient reports potential for pregnancy in the future. She denies being currently pregnant.  Her current diet includes grilling or baking food, vegetables with drinks mostly being water, Pepsi 1x a month, coffee (2 tablespoons of sugar, creamer), unsweetened tea. Exercise consists mostly of walking.   Review of Systems  Constitutional: Negative for chills.  Eyes: Negative for visual disturbance.  Respiratory: Negative for chest tightness and shortness of breath.   Cardiovascular: Negative for chest pain.  Gastrointestinal: Negative for abdominal pain, blood in stool and diarrhea.  Endocrine: Negative for polyuria.  Genitourinary: Negative for difficulty urinating and hematuria.  Neurological: Negative for dizziness, syncope and numbness.   Deanna Zimmerman  has a past medical history of Diabetes mellitus without complication (HCC), Goiter (02/07/2017), and Hypertension.  Family History, Social History, current Medications and allergies reviewed and updated in Epic.  Objective:  There were no vitals taken for this visit.  Physical Exam No physical exam conducted given TeleHealth visit.  Data : I have personally reviewed pertinent labs and imaging studies, if indicated,  with the patient in clinic today.   Lab Orders  No laboratory test(s) ordered today    HC Readings from Last 3 Encounters:  [facility]    Wt Readings from Last 3 Encounters:  01/09/19 150 lb (68 kg)  12/08/18 150 lb (68 kg)  12/07/18 150 lb (68 kg)

## 2019-07-11 ENCOUNTER — Telehealth: Payer: Self-pay | Admitting: Internal Medicine

## 2019-07-11 NOTE — Telephone Encounter (Signed)
Left voicemail on 9/17 @2 :57 pm. Voicemail asked pt to call back to schedule f/u appointment for endo on December 22nd.

## 2019-07-17 ENCOUNTER — Encounter: Payer: Self-pay | Admitting: Internal Medicine

## 2019-08-01 ENCOUNTER — Ambulatory Visit (INDEPENDENT_AMBULATORY_CARE_PROVIDER_SITE_OTHER): Payer: Medicaid Other | Admitting: Advanced Practice Midwife

## 2019-08-01 ENCOUNTER — Other Ambulatory Visit: Payer: Self-pay

## 2019-08-01 ENCOUNTER — Other Ambulatory Visit (HOSPITAL_COMMUNITY)
Admission: RE | Admit: 2019-08-01 | Discharge: 2019-08-01 | Disposition: A | Discharge status: Home / Self Care | Payer: Medicaid Other | Source: Ambulatory Visit | Attending: Advanced Practice Midwife | Admitting: Advanced Practice Midwife

## 2019-08-01 ENCOUNTER — Encounter: Payer: Self-pay | Admitting: Advanced Practice Midwife

## 2019-08-01 VITALS — BP 124/74 | Wt 148.0 lb

## 2019-08-01 DIAGNOSIS — Z113 Encounter for screening for infections with a predominantly sexual mode of transmission: Secondary | ICD-10-CM | POA: Insufficient documentation

## 2019-08-01 DIAGNOSIS — O0990 Supervision of high risk pregnancy, unspecified, unspecified trimester: Secondary | ICD-10-CM | POA: Diagnosis present

## 2019-08-01 DIAGNOSIS — O34219 Maternal care for unspecified type scar from previous cesarean delivery: Secondary | ICD-10-CM | POA: Diagnosis not present

## 2019-08-01 DIAGNOSIS — Z3A01 Less than 8 weeks gestation of pregnancy: Secondary | ICD-10-CM

## 2019-08-01 DIAGNOSIS — O0993 Supervision of high risk pregnancy, unspecified, third trimester: Secondary | ICD-10-CM | POA: Insufficient documentation

## 2019-08-01 NOTE — Patient Instructions (Signed)
Exercise During Pregnancy Exercise is an important part of being healthy for people of all ages. Exercise improves the function of your heart and lungs and helps you maintain strength, flexibility, and a healthy body weight. Exercise also boosts energy levels and elevates mood. Most women should exercise regularly during pregnancy. In rare cases, women with certain medical conditions or complications may be asked to limit or avoid exercise during pregnancy. How does this affect me? Along with maintaining general strength and flexibility, exercising during pregnancy can help:  Keep strength in muscles that are used during labor and childbirth.  Decrease low back pain.  Reduce symptoms of depression.  Control weight gain during pregnancy.  Reduce the risk of needing insulin if you develop diabetes during pregnancy.  Decrease the risk of cesarean delivery.  Speed up your recovery after giving birth. How does this affect my baby? Exercise can help you have a healthy pregnancy. Exercise does not cause premature birth. It will not cause your baby to weigh less at birth. What exercises can I do? Many exercises are safe for you to do during pregnancy. Do a variety of exercises that safely increase your heart and breathing rates and help you build and maintain muscle strength. Do exercises exactly as told by your health care provider. You may do these exercises:  Walking or hiking.  Swimming.  Water aerobics.  Riding a stationary bike.  Strength training.  Modified yoga or Pilates. Tell your instructor that you are pregnant. Avoid overstretching, and avoid lying on your back for long periods of time.  Running or jogging. Only choose this type of exercise if you: ? Ran or jogged regularly before your pregnancy. ? Can run or jog and still talk in complete sentences. What exercises should I avoid? Depending on your level of fitness and whether you exercised regularly before your  pregnancy, you may be told to limit high-intensity exercise. You can tell that you are exercising at a high intensity if you are breathing much harder and faster and cannot hold a conversation while exercising. You must avoid:  Contact sports.  Activities that put you at risk for falling on or being hit in the belly, such as downhill skiing, water skiing, surfing, rock climbing, cycling, gymnastics, and horseback riding.  Scuba diving.  Skydiving.  Yoga or Pilates in a room that is heated to high temperatures.  Jogging or running, unless you ran or jogged regularly before your pregnancy. While jogging or running, you should always be able to talk in full sentences. Do not run or jog so fast that you are unable to have a conversation.  Do not exercise at more than 6,000 feet above sea level (high elevation) if you are not used to exercising at high elevation. How do I exercise in a safe way?   Avoid overheating. Do not exercise in very high temperatures.  Wear loose-fitting, breathable clothes.  Avoid dehydration. Drink enough water before, during, and after exercise to keep your urine pale yellow.  Avoid overstretching. Because of hormone changes during pregnancy, it is easy to overstretch muscles, tendons, and ligaments during pregnancy.  Start slowly and ask your health care provider to recommend the types of exercise that are safe for you.  Do not exercise to lose weight. Follow these instructions at home:  Exercise on most days or all days of the week. Try to exercise for 30 minutes a day, 5 days a week, unless your health care provider tells you not to.  If  you actively exercised before your pregnancy and you are healthy, your health care provider may tell you to continue to do moderate to high-intensity exercise.  If you are just starting to exercise or did not exercise much before your pregnancy, your health care provider may tell you to do low to moderate-intensity  exercise. Questions to ask your health care provider  Is exercise safe for me?  What are signs that I should stop exercising?  Does my health condition mean that I should not exercise during pregnancy?  When should I avoid exercising during pregnancy? Stop exercising and contact a health care provider if: You have any unusual symptoms, such as:  Mild contractions of the uterus or cramps in the abdomen.  Dizziness that does not go away when you rest. Stop exercising and get help right away if: You have any unusual symptoms, such as:  Sudden, severe pain in your low back or your belly.  Mild contractions of the uterus or cramps in the abdomen that do not improve with rest and drinking fluids.  Chest pain.  Bleeding or fluid leaking from your vagina.  Shortness of breath. These symptoms may represent a serious problem that is an emergency. Do not wait to see if the symptoms will go away. Get medical help right away. Call your local emergency services (911 in the [place] Do not drive yourself to the hospital. Summary  Most women should exercise regularly throughout pregnancy. In rare cases, women with certain medical conditions or complications may be asked to limit or avoid exercise during pregnancy.  Do not exercise to lose weight during pregnancy.  Your health care provider will tell you what level of physical activity is right for you.  Stop exercising and contact a health care provider if you have mild contractions of the uterus or cramps in the abdomen. Get help right away if these contractions or cramps do not improve with rest and drinking fluids.  Stop exercising and get help right away if you have sudden, severe pain in your low back or belly, chest pain, shortness of breath, or bleeding or leaking of fluid from your vagina. This information is not intended to replace advice given to you by your health care provider. Make sure you discuss any questions you have with your  health care provider. Document Released: 10/10/2005 Document Revised: 01/31/2019 Document Reviewed: 11/14/2018 Elsevier Patient Education  2020 [employer] Eating Plan for Pregnant Women While you are pregnant, your body requires additional nutrition to help support your growing baby. You also have a higher need for some vitamins and minerals, such as folic acid, calcium, iron, and vitamin D. Eating a healthy, well-balanced diet is very important for your health and your baby's health. Your need for extra calories varies for the three 3-month segments of your pregnancy (trimesters). For most women, it is recommended to consume:  150 extra calories a day during the first trimester.  300 extra calories a day during the second trimester.  300 extra calories a day during the third trimester. What are tips for following this plan?   Do not try to lose weight or go on a diet during pregnancy.  Limit your overall intake of foods that have "empty calories." These are foods that have little nutritional value, such as sweets, desserts, candies, and sugar-sweetened beverages.  Eat a variety of foods (especially fruits and vegetables) to get a full range of vitamins and minerals.  Take a prenatal vitamin to help meet your additional vitamin   and mineral needs during pregnancy, specifically for folic acid, iron, calcium, and vitamin D.  Remember to stay active. Ask your health care provider what types of exercise and activities are safe for you.  Practice good food safety and cleanliness. Wash your hands before you eat and after you prepare raw meat. Wash all fruits and vegetables well before peeling or eating. Taking these actions can help to prevent food-borne illnesses that can be very dangerous to your baby, such as listeriosis. Ask your health care provider for more information about listeriosis. What does 150 extra calories look like? Healthy options that provide 150 extra calories each day  could be any of the following:  6-8 oz (170-230 g) of plain low-fat yogurt with  cup of berries.  1 apple with 2 teaspoons (11 g) of peanut butter.  Cut-up vegetables with  cup (60 g) of hummus.  8 oz (230 mL) or 1 cup of low-fat chocolate milk.  1 stick of string cheese with 1 medium orange.  1 peanut butter and jelly sandwich that is made with one slice of whole-wheat bread and 1 tsp (5 g) of peanut butter. For 300 extra calories, you could eat two of those healthy options each day. What is a healthy amount of weight to gain? The right amount of weight gain for you is based on your BMI before you became pregnant. If your BMI:  Was less than 18 (underweight), you should gain 28-40 lb (13-18 kg).  Was 18-24.9 (normal), you should gain 25-35 lb (11-16 kg).  Was 25-29.9 (overweight), you should gain 15-25 lb (7-11 kg).  Was 30 or greater (obese), you should gain 11-20 lb (5-9 kg). What if I am having twins or multiples? Generally, if you are carrying twins or multiples:  You may need to eat 300-600 extra calories a day.  The recommended range for total weight gain is 25-54 lb (11-25 kg), depending on your BMI before pregnancy.  Talk with your health care provider to find out about nutritional needs, weight gain, and exercise that is right for you. What foods can I eat?  Grains All grains. Choose whole grains, such as whole-wheat bread, oatmeal, or brown rice. Vegetables All vegetables. Eat a variety of colors and types of vegetables. Remember to wash your vegetables well before peeling or eating. Fruits All fruits. Eat a variety of colors and types of fruit. Remember to wash your fruits well before peeling or eating. Meats and other protein foods Lean meats, including chicken, turkey, fish, and lean cuts of beef, veal, or pork. If you eat fish or seafood, choose options that are higher in omega-3 fatty acids and lower in mercury, such as salmon, herring, mussels, trout,  sardines, pollock, shrimp, crab, and lobster. Tofu. Tempeh. Beans. Eggs. Peanut butter and other nut butters. Make sure that all meats, poultry, and eggs are cooked to food-safe temperatures or "well-done." Two or more servings of fish are recommended each week in order to get the most benefits from omega-3 fatty acids that are found in seafood. Choose fish that are lower in mercury. You can find more information online:  www.fda.gov Dairy Pasteurized milk and milk alternatives (such as almond milk). Pasteurized yogurt and pasteurized cheese. Cottage cheese. Sour cream. Beverages Water. Juices that contain 100% fruit juice or vegetable juice. Caffeine-free teas and decaffeinated coffee. Drinks that contain caffeine are okay to drink, but it is better to avoid caffeine. Keep your total caffeine intake to less than 200 mg each day (which   is 12 oz or 355 mL of coffee, tea, or soda) or the limit as told by your health care provider. Fats and oils Fats and oils are okay to include in moderation. Sweets and desserts Sweets and desserts are okay to include in moderation. Seasoning and other foods All pasteurized condiments. The items listed above may not be a complete list of recommended foods and beverages. Contact your dietitian for more options. The items listed above may not be a complete list of foods and beverages [you/your child] can eat. Contact a dietitian for more information. What foods are not recommended? Vegetables Raw (unpasteurized) vegetable juices. Fruits Unpasteurized fruit juices. Meats and other protein foods Lunch meats, bologna, hot dogs, or other deli meats. (If you must eat those meats, reheat them until they are steaming hot.) Refrigerated pat, meat spreads from a meat counter, smoked seafood that is found in the refrigerated section of a store. Raw or undercooked meats, poultry, and eggs. Raw fish, such as sushi or sashimi. Fish that have high mercury content, such as  tilefish, shark, swordfish, and king mackerel. To learn more about mercury in fish, talk with your health care provider or look for online resources, such as:  www.fda.gov Dairy Raw (unpasteurized) milk and any foods that have raw milk in them. Soft cheeses, such as feta, queso blanco, queso fresco, Brie, Camembert cheeses, blue-veined cheeses, and Panela cheese (unless it is made with pasteurized milk, which must be stated on the label). Beverages Alcohol. Sugar-sweetened beverages, such as sodas, teas, or energy drinks. Seasoning and other foods Homemade fermented foods and drinks, such as pickles, sauerkraut, or kombucha drinks. (Store-bought pasteurized versions of these are okay.) Salads that are made in a store or deli, such as ham salad, chicken salad, egg salad, tuna salad, and seafood salad. The items listed above may not be a complete list of foods and beverages to avoid. Contact your dietitian for more information. The items listed above may not be a complete list of foods and beverages [you/your child] should avoid. Contact a dietitian for more information. Where to find more information To calculate the number of calories you need based on your height, weight, and activity level, you can use an online calculator such as:  www.choosemyplate.gov/MyPlatePlan To calculate how much weight you should gain during pregnancy, you can use an online pregnancy weight gain calculator such as:  www.choosemyplate.gov/pregnancy-weight-gain-calculator Summary  While you are pregnant, your body requires additional nutrition to help support your growing baby.  Eat a variety of foods, especially fruits and vegetables to get a full range of vitamins and minerals.  Practice good food safety and cleanliness. Wash your hands before you eat and after you prepare raw meat. Wash all fruits and vegetables well before peeling or eating. Taking these actions can help to prevent food-borne illnesses, such as  listeriosis, that can be very dangerous to your baby.  Do not eat raw meat or fish. Do not eat fish that have high mercury content, such as tilefish, shark, swordfish, and king mackerel. Do not eat unpasteurized (raw) dairy.  Take a prenatal vitamin to help meet your additional vitamin and mineral needs during pregnancy, specifically for folic acid, iron, calcium, and vitamin D. This information is not intended to replace advice given to you by your health care provider. Make sure you discuss any questions you have with your health care provider. Document Released: 07/25/2014 Document Revised: 01/31/2019 Document Reviewed: 07/07/2017 Elsevier Patient Education  2020 Else[employer] Inc. Prenatal Care Prenatal care   is health care during pregnancy. It helps you and your unborn baby (fetus) stay as healthy as possible. Prenatal care may be provided by a midwife, a family practice health care provider, or a childbirth and pregnancy specialist (obstetrician). How does this affect me? During pregnancy, you will be closely monitored for any new conditions that might develop. To lower your risk of pregnancy complications, you and your health care provider will talk about any underlying conditions you have. How does this affect my baby? Early and consistent prenatal care increases the chance that your baby will be healthy during pregnancy. Prenatal care lowers the risk that your baby will be:  Born early (prematurely).  Smaller than expected at birth (small for gestational age). What can I expect at the first prenatal care visit? Your first prenatal care visit will likely be the longest. You should schedule your first prenatal care visit as soon as you know that you are pregnant. Your first visit is a good time to talk about any questions or concerns you have about pregnancy. At your visit, you and your health care provider will talk about:  Your medical history, including: ? Any past pregnancies. ? Your  family's medical history. ? The baby's father's medical history. ? Any long-term (chronic) health conditions you have and how you manage them. ? Any surgeries or procedures you have had. ? Any current over-the-counter or prescription medicines, herbs, or supplements you are taking.  Other factors that could pose a risk to your baby, including:  Your home setting and your stress levels, including: ? Exposure to abuse or violence. ? Household financial strain. ? Mental health conditions you have.  Your daily health habits, including diet and exercise. Your health care provider will also:  Measure your weight, height, and blood pressure.  Do a physical exam, including a pelvic and breast exam.  Perform blood tests and urine tests to check for: ? Urinary tract infection. ? Sexually transmitted infections (STIs). ? Low iron levels in your blood (anemia). ? Blood type and certain proteins on red blood cells (Rh antibodies). ? Infections and immunity to viruses, such as hepatitis B and rubella. ? HIV (human immunodeficiency virus).  Do an ultrasound to confirm your baby's growth and development and to help predict your estimated due date (EDD). This ultrasound is done with a probe that is inserted into the vagina (transvaginal ultrasound).  Discuss your options for genetic screening.  Give you information about how to keep yourself and your baby healthy, including: ? Nutrition and taking vitamins. ? Physical activity. ? How to manage pregnancy symptoms such as nausea and vomiting (morning sickness). ? Infections and substances that may be harmful to your baby and how to avoid them. ? Food safety. ? Dental care. ? Working. ? Travel. ? Warning signs to watch for and when to call your health care provider. How often will I have prenatal care visits? After your first prenatal care visit, you will have regular visits throughout your pregnancy. The visit schedule is often as follows:   Up to week 28 of pregnancy: once every 4 weeks.  28-36 weeks: once every 2 weeks.  After 36 weeks: every week until delivery. Some women may have visits more or less often depending on any underlying health conditions and the health of the baby. Keep all follow-up and prenatal care visits as told by your health care provider. This is important. What happens during routine prenatal care visits? Your health care provider will:  Measure your   weight and blood pressure.  Check for fetal heart sounds.  Measure the height of your uterus in your abdomen (fundal height). This may be measured starting around week 20 of pregnancy.  Check the position of your baby inside your uterus.  Ask questions about your diet, sleeping patterns, and whether you can feel the baby move.  Review warning signs to watch for and signs of labor.  Ask about any pregnancy symptoms you are having and how you are dealing with them. Symptoms may include: ? Headaches. ? Nausea and vomiting. ? Vaginal discharge. ? Swelling. ? Fatigue. ? Constipation. ? Any discomfort, including back or pelvic pain. Make a list of questions to ask your health care provider at your routine visits. What tests might I have during prenatal care visits? You may have blood, urine, and imaging tests throughout your pregnancy, such as:  Urine tests to check for glucose, protein, or signs of infection.  Glucose tests to check for a form of diabetes that can develop during pregnancy (gestational diabetes mellitus). This is usually done around week 24 of pregnancy.  An ultrasound to check your baby's growth and development and to check for birth defects. This is usually done around week 20 of pregnancy.  A test to check for group B strep (GBS) infection. This is usually done around week 36 of pregnancy.  Genetic testing. This may include blood or imaging tests, such as an ultrasound. Some genetic tests are done during the first trimester  and some are done during the second trimester. What else can I expect during prenatal care visits? Your health care provider may recommend getting certain vaccines during pregnancy. These may include:  A yearly flu shot (annual influenza vaccine). This is especially important if you will be pregnant during flu season.  Tdap (tetanus, diphtheria, pertussis) vaccine. Getting this vaccine during pregnancy can protect your baby from whooping cough (pertussis) after birth. This vaccine may be recommended between weeks 27 and 36 of pregnancy. Later in your pregnancy, your health care provider may give you information about:  Childbirth and breastfeeding classes.  Choosing a health care provider for your baby.  Umbilical cord banking.  Breastfeeding.  Birth control after your baby is born.  The hospital labor and delivery unit and how to tour it.  Registering at the hospital before you go into labor. Where to find more information  Office on Women's Health: womenshealth.gov  American Pregnancy Association: americanpregnancy.org  March of Dimes: marchofdimes.org Summary  Prenatal care helps you and your baby stay as healthy as possible during pregnancy.  Your first prenatal care visit will most likely be the longest.  You will have visits and tests throughout your pregnancy to monitor your health and your baby's health.  Bring a list of questions to your visits to ask your health care provider.  Make sure to keep all follow-up and prenatal care visits with your health care provider. This information is not intended to replace advice given to you by your health care provider. Make sure you discuss any questions you have with your health care provider. Document Released: 10/13/2003 Document Revised: 01/30/2019 Document Reviewed: 10/09/2017 Elsevier Patient Education  2020 Elsevier Inc.    COVID-19 and Your Pregnancy FAQ  How can I prevent infection with COVID-19 during my  pregnancy? Social distancing is key. Please limit any interactions in public. Try and work from home if possible. Frequently wash your hands after touching possibly contaminated surfaces. Avoid touching your face.  Minimize trips to   the store. Consider online ordering when possible.   Should I wear a mask? YES. It is recommended by the CDC that all people wear a cloth mask or facial covering in public. You should wear a mask to your visits in the office. This will help reduce transmission as well as your risk or acquiring COVID-19. New studies are showing that even asymptomatic individuals can spread the virus from talking.   Where can I get a mask? [place] and the city of [place] are partnering to provide masks to community members. You can pick up a mask from several locations. This website also has instructions about how to make a mask by sewing or without sewing by using a t-shirt or bandana.  https://www.[place].gov/i-want-to/learn-about/covid-19-information-and-updates/covid-19-face-mask-project  Studies have shown that if you were a tube or nylon stocking from pantyhose over a cloth mask it makes the cloth mask almost as effective as a N95 mask.  https://www.davis-walter.com/  What are the symptoms of COVID-19? Fever (greater than 100.4 F), dry cough, shortness of breath.  Am I more at risk for COVID-19 since I am pregnant? There is not currently data showing that pregnant women are more adversely impacted by COVID-19 than the general population. However, we know that pregnant women tend to have worse respiratory complications from similar diseases such as the flu and SARS and for this reason should be considered an at-risk population.  What do I do if I am experiencing the symptoms of COVID-19? Testing is being limited because of test availability. If you are  experiencing symptoms you should quarantine yourself, and the members of your family, for at least 2 weeks at home.   Please visit this website for more information: [email or web address].co.za.html  When should I go to the Emergency Room? Please go to the emergency room if you are experiencing ANY of these symptoms*:  1.    Difficulty breathing or shortness of breath 2.    Persistent pain or pressure in the chest 3.    Confusion or difficulty being aroused (or awakened) 4.    Bluish lips or face  *This list is not all inclusive. Please consult our office for any other symptoms that are severe or concerning.  What do I do if I am having difficulty breathing? You should go to the Emergency Room for evaluation. At this time they have a tent set up for evaluating patients with COVID-19 symptoms.   How will my prenatal care be different because of the COVID-19 pandemic? It has been recommended to reduce the frequency of face-to-face visits and use resources such as telephone and virtual visits when possible. Using a scale, blood pressure machine and fetal doppler at home can further help reduce face-to-face visits. You will be provided with additional information on this topic.  We ask that you come to your visits alone to minimize potential exposures to  COVID-19.  How can I receive childbirth education? At this time in-person classes have been cancelled. You can register for online childbirth education, breastfeeding, and newborn care classes.  Please visit:  [email or web address] for more information  How will my hospital birth experience be different? The hospital is currently limiting visitors. This means that while you are in labor you can only have one person at the hospital with you. Additional family members will not be allowed to wait in the building or outside your room. Your one support person can be the father of the baby, a  relative, a doula, or a friend. Once one support person  is designated that person will wear a band. This band cannot be shared with multiple people.  Nitrous Gas is not being offered for pain relief since the tubing and filter for the machine can not be sanitized in a way to guarantee prevention of transmission of COVID-19.  Nasal cannula use of oxygen for fetal indications has also been discontinued.  Currently a clear plastic sheet is being hung between mom and the delivering provider during pushing and delivery to help prevent transmission of COVID-19.      How long will I stay in the hospital for after giving birth? It is also recommended that discharge home be expedited during the COVID-19 outbreak. This means staying for 1 day after a vaginal delivery and 2 days after a cesarean section. Patients who need to stay longer for medical reasons are allowed to do so, but the goal will be for expedited discharge home.   What if I have COVID-19 and I am in labor? We ask that you wear a mask while on labor and delivery. We will try and accommodate you being placed in a room that is capable of filtering the air. Please call ahead if you are in labor and on your way to the hospital. The phone number for labor and delivery at [facility] is [phone].  If I have COVID-19 when my baby is born how can I prevent my baby from contracting COVID-19? This is an issue that will have to be discussed on a case-by-case basis. Current recommendations suggest providing separate isolation rooms for both the mother and new infant as well as limiting visitors. However, there are practical challenges to this recommendation. The situation will assuredly change and decisions will be influenced by the desires of the mother and availability of space.  Some suggestions are the use of a curtain or physical barrier between mom and infant, hand hygiene, mom wearing a mask, or 6 feet of spacing between a  mom and infant.   Can I breastfeed during the COVID-19 pandemic?   Yes, breastfeeding is encouraged.  Can I breastfeed if I have COVID-19? Yes. Covid-19 has not been found in breast milk. This means you cannot give COVID-19 to your child through breast milk. Breast feeding will also help pass antibodies to fight infection to your baby.   What precautions should I take when breastfeeding if I have COVID-19? If a mother and newborn do room-in and the mother wishes to feed at the breast, she should put on a facemask and practice hand hygiene before each feeding.  What precautions should I take when pumping if I have COVID-19? Prior to expressing breast milk, mothers should practice hand hygiene. After each pumping session, all parts that come into contact with breast milk should be thoroughly washed and the entire pump should be appropriately disinfected per the manufacturer's instructions. This expressed breast milk should be fed to the newborn by a healthy caregiver.  What if I am pregnant and work in healthcare? Based on limited data regarding COVID-19 and pregnancy, ACOG currently does not propose creating additional restrictions on pregnant health care personnel because of COVID-19 alone. Pregnant women do not appear to be at higher risk of severe disease related to COVID-19. Pregnant health care personnel should follow CDC risk assessment and infection control guidelines for health care personnel exposed to patients with suspected or confirmed COVID-19. Adherence to recommended infection prevention and control practices is an important part of protecting all health care personnel in   health care settings.    Information on COVID-19 in pregnancy is very limited; however, facilities may want to consider limiting exposure of pregnant health care personnel to patients with confirmed or suspected COVID-19 infection, especially during higher-risk procedures (eg, aerosol-generating procedures), if  feasible, based on staffing availability.    Diabetes Mellitus and Exercise Exercising regularly is important for your overall health, especially when you have diabetes (diabetes mellitus). Exercising is not only about losing weight. It has many other health benefits, such as increasing muscle strength and bone density and reducing body fat and stress. This leads to improved fitness, flexibility, and endurance, all of which result in better overall health. Exercise has additional benefits for people with diabetes, including:  Reducing appetite.  Helping to lower and control blood glucose.  Lowering blood pressure.  Helping to control amounts of fatty substances (lipids) in the blood, such as cholesterol and triglycerides.  Helping the body to respond better to insulin (improving insulin sensitivity).  Reducing how much insulin the body needs.  Decreasing the risk for heart disease by: ? Lowering cholesterol and triglyceride levels. ? Increasing the levels of good cholesterol. ? Lowering blood glucose levels. What is my activity plan? Your health care provider or certified diabetes educator can help you make a plan for the type and frequency of exercise (activity plan) that works for you. Make sure that you:  Do at least 150 minutes of moderate-intensity or vigorous-intensity exercise each week. This could be brisk walking, biking, or water aerobics. ? Do stretching and strength exercises, such as yoga or weightlifting, at least 2 times a week. ? Spread out your activity over at least 3 days of the week.  Get some form of physical activity every day. ? Do not go more than 2 days in a row without some kind of physical activity. ? Avoid being inactive for more than 30 minutes at a time. Take frequent breaks to walk or stretch.  Choose a type of exercise or activity that you enjoy, and set realistic goals.  Start slowly, and gradually increase the intensity of your exercise over  time. What do I need to know about managing my diabetes?   Check your blood glucose before and after exercising. ? If your blood glucose is 240 mg/dL (16.113.3 mmol/L) or higher before you exercise, check your urine for ketones. If you have ketones in your urine, do not exercise until your blood glucose returns to normal. ? If your blood glucose is 100 mg/dL (5.6 mmol/L) or lower, eat a snack containing 15-20 grams of carbohydrate. Check your blood glucose 15 minutes after the snack to make sure that your level is above 100 mg/dL (5.6 mmol/L) before you start your exercise.  Know the symptoms of low blood glucose (hypoglycemia) and how to treat it. Your risk for hypoglycemia increases during and after exercise. Common symptoms of hypoglycemia can include: ? Hunger. ? Anxiety. ? Sweating and feeling clammy. ? Confusion. ? Dizziness or feeling light-headed. ? Increased heart rate or palpitations. ? Blurry vision. ? Tingling or numbness around the mouth, lips, or tongue. ? Tremors or shakes. ? Irritability.  Keep a rapid-acting carbohydrate snack available before, during, and after exercise to help prevent or treat hypoglycemia.  Avoid injecting insulin into areas of the body that are going to be exercised. For example, avoid injecting insulin into: ? The arms, when playing tennis. ? The legs, when jogging.  Keep records of your exercise habits. Doing this can help you and your  health care provider adjust your diabetes management plan as needed. Write down: ? Food that you eat before and after you exercise. ? Blood glucose levels before and after you exercise. ? The type and amount of exercise you have done. ? When your insulin is expected to peak, if you use insulin. Avoid exercising at times when your insulin is peaking.  When you start a new exercise or activity, work with your health care provider to make sure the activity is safe for you, and to adjust your insulin, medicines, or food  intake as needed.  Drink plenty of water while you exercise to prevent dehydration or heat stroke. Drink enough fluid to keep your urine clear or pale yellow. Summary  Exercising regularly is important for your overall health, especially when you have diabetes (diabetes mellitus).  Exercising has many health benefits, such as increasing muscle strength and bone density and reducing body fat and stress.  Your health care provider or certified diabetes educator can help you make a plan for the type and frequency of exercise (activity plan) that works for you.  When you start a new exercise or activity, work with your health care provider to make sure the activity is safe for you, and to adjust your insulin, medicines, or food intake as needed. This information is not intended to replace advice given to you by your health care provider. Make sure you discuss any questions you have with your health care provider. Document Released: 12/31/2003 Document Revised: 05/04/2017 Document Reviewed: 03/21/2016 Elsevier Patient Education  2020 [employer]  Diabetes Mellitus and Nutrition, Adult When you have diabetes (diabetes mellitus), it is very important to have healthy eating habits because your blood sugar (glucose) levels are greatly affected by what you eat and drink. Eating healthy foods in the appropriate amounts, at about the same times every day, can help you:  Control your blood glucose.  Lower your risk of heart disease.  Improve your blood pressure.  Reach or maintain a healthy weight. Every person with diabetes is different, and each person has different needs for a meal plan. Your health care provider may recommend that you work with a diet and nutrition specialist (dietitian) to make a meal plan that is best for you. Your meal plan may vary depending on factors such as:  The calories you need.  The medicines you take.  Your weight.  Your blood glucose, blood pressure, and  cholesterol levels.  Your activity level.  Other health conditions you have, such as heart or kidney disease. How do carbohydrates affect me? Carbohydrates, also called carbs, affect your blood glucose level more than any other type of food. Eating carbs naturally raises the amount of glucose in your blood. Carb counting is a method for keeping track of how many carbs you eat. Counting carbs is important to keep your blood glucose at a healthy level, especially if you use insulin or take certain oral diabetes medicines. It is important to know how many carbs you can safely have in each meal. This is different for every person. Your dietitian can help you calculate how many carbs you should have at each meal and for each snack. Foods that contain carbs include:  Bread, cereal, rice, pasta, and crackers.  Potatoes and corn.  Peas, beans, and lentils.  Milk and yogurt.  Fruit and juice.  Desserts, such as cakes, cookies, ice cream, and candy. How does alcohol affect me? Alcohol can cause a sudden decrease in blood glucose (hypoglycemia),  especially if you use insulin or take certain oral diabetes medicines. Hypoglycemia can be a life-threatening condition. Symptoms of hypoglycemia (sleepiness, dizziness, and confusion) are similar to symptoms of having too much alcohol. If your health care provider says that alcohol is safe for you, follow these guidelines:  Limit alcohol intake to no more than 1 drink per day for nonpregnant women and 2 drinks per day for men. One drink equals 12 oz of beer, 5 oz of wine, or 1 oz of hard liquor.  Do not drink on an empty stomach.  Keep yourself hydrated with water, diet soda, or unsweetened iced tea.  Keep in mind that regular soda, juice, and other mixers may contain a lot of sugar and must be counted as carbs. What are tips for following this plan?  Reading food labels  Start by checking the serving size on the "Nutrition Facts" label of packaged  foods and drinks. The amount of calories, carbs, fats, and other nutrients listed on the label is based on one serving of the item. Many items contain more than one serving per package.  Check the total grams (g) of carbs in one serving. You can calculate the number of servings of carbs in one serving by dividing the total carbs by 15. For example, if a food has 30 g of total carbs, it would be equal to 2 servings of carbs.  Check the number of grams (g) of saturated and trans fats in one serving. Choose foods that have low or no amount of these fats.  Check the number of milligrams (mg) of salt (sodium) in one serving. Most people should limit total sodium intake to less than 2,300 mg per day.  Always check the nutrition information of foods labeled as "low-fat" or "nonfat". These foods may be higher in added sugar or refined carbs and should be avoided.  Talk to your dietitian to identify your daily goals for nutrients listed on the label. Shopping  Avoid buying canned, premade, or processed foods. These foods tend to be high in fat, sodium, and added sugar.  Shop around the outside edge of the grocery store. This includes fresh fruits and vegetables, bulk grains, fresh meats, and fresh dairy. Cooking  Use low-heat cooking methods, such as baking, instead of high-heat cooking methods like deep frying.  Cook using healthy oils, such as olive, canola, or sunflower oil.  Avoid cooking with butter, cream, or high-fat meats. Meal planning  Eat meals and snacks regularly, preferably at the same times every day. Avoid going long periods of time without eating.  Eat foods high in fiber, such as fresh fruits, vegetables, beans, and whole grains. Talk to your dietitian about how many servings of carbs you can eat at each meal.  Eat 4-6 ounces (oz) of lean protein each day, such as lean meat, chicken, fish, eggs, or tofu. One oz of lean protein is equal to: ? 1 oz of meat, chicken, or fish. ? 1  egg. ?  cup of tofu.  Eat some foods each day that contain healthy fats, such as avocado, nuts, seeds, and fish. Lifestyle  Check your blood glucose regularly.  Exercise regularly as told by your health care provider. This may include: ? 150 minutes of moderate-intensity or vigorous-intensity exercise each week. This could be brisk walking, biking, or water aerobics. ? Stretching and doing strength exercises, such as yoga or weightlifting, at least 2 times a week.  Take medicines as told by your health care provider.  Do not use any products that contain nicotine or tobacco, such as cigarettes and e-cigarettes. If you need help quitting, ask your health care provider.  Work with a Social worker or diabetes educator to identify strategies to manage stress and any emotional and social challenges. Questions to ask a health care provider  Do I need to meet with a diabetes educator?  Do I need to meet with a dietitian?  What number can I call if I have questions?  When are the best times to check my blood glucose? Where to find more information:  American Diabetes Association: diabetes.org  Academy of Nutrition and Dietetics: www.eatright.[employer] of Diabetes and Digestive and Kidney Diseases (NIH): [employer] Summary  A healthy meal plan will help you control your blood glucose and maintain a healthy lifestyle.  Working with a diet and nutrition specialist (dietitian) can help you make a meal plan that is best for you.  Keep in mind that carbohydrates (carbs) and alcohol have immediate effects on your blood glucose levels. It is important to count carbs and to use alcohol carefully. This information is not intended to replace advice given to you by your health care provider. Make sure you discuss any questions you have with your health care provider. Document Released: 07/07/2005 Document Revised: 09/22/2017 Document Reviewed: 11/14/2016 Elsevier Patient  Education  2020 [employer].

## 2019-08-01 NOTE — Progress Notes (Signed)
NOB today. LMP: 06/18/2019

## 2019-08-02 LAB — PROTEIN / CREATININE RATIO, URINE
Creatinine, Urine: 98.3 mg/dL
Protein, Ur: 9.5 mg/dL
Protein/Creat Ratio: 97 mg/g creat (ref 0–200)

## 2019-08-02 NOTE — Progress Notes (Signed)
New Obstetric Patient H&P    Chief Complaint: "Desires prenatal care"   History of Present Illness: Patient is a 33 y.o. [id number] Not Hispanic or Latino female, presents with amenorrhea and positive home pregnancy test. Patient's last menstrual period was 06/18/2019 (exact date). and based on her  LMP, her EDD is Estimated Date of Delivery: 03/24/20 and her EGA is 5064w3d. Cycles are 7. days, regular, and occur approximately every : 28 days. Her last pap smear was 3 or [facility] and was no abnormalities.    She had a urine pregnancy test which was positive 2 week(s)  ago. Her last menstrual period was normal and lasted for  7 day(s). Since her LMP she claims she has experienced breast tenderness, fatigue, nausea. She denies vaginal bleeding. Her past medical history is contributory for diagnoses of Type 2 Diabetes and essential hypertension in 2018. Her medications include Metformin, Insulin and Hydrochlorothiazide. Her prior pregnancies are notable for Primary FT c/s in 2006 for failure to progress 8#, Repeat FT c/s in 2009 9#10oz.   [facility] [address]  Since her LMP, she admits to the use of tobacco products  no She claims she has gained  3 pounds since the start of her pregnancy.  There are cats in the home in the home  no  She admits close contact with children on a regular basis  yes  She has had chicken pox in the past yes She has had Tuberculosis exposures, symptoms, or previously tested positive for TB   no Current or past history of domestic violence. no  Genetic Screening/Teratology Counseling: (Includes patient, baby's father, or anyone in either family with:)   1. [facility]= [facility]  no 2. Thalassemia ([place], [place], Mediterranean, or Asian background): MCV<80  no 3. Neural tube defect (meningomyelocele, spina bifida, anencephaly)  no 4. Congenital heart defect  no  5. Down syndrome  no 6. Tay-Sachs  (Jewish, [place] Canadian)  no 7. Canavan's Disease  no 8. Sickle cell disease or trait (African)  no  9. Hemophilia or other blood disorders  no  10. Muscular dystrophy  no  11. Cystic fibrosis  no  12. Huntington's Chorea  no  13. Mental retardation/autism  no 14. Other inherited genetic or chromosomal disorder  no 15. Maternal metabolic disorder (DM, PKU, etc)  no 16. Patient or FOB with a child with a birth defect not listed above no  16a. Patient or FOB with a birth defect themselves no 17. Recurrent pregnancy loss, or stillbirth  no  18. Any medications since LMP other than prenatal vitamins (include vitamins, supplements, OTC meds, drugs, alcohol)  Metformin, Insulin, Hydrochlorothiazide. She discontinued Losartan with positive pregnancy test. 19. Any other genetic/environmental exposure to discuss  Her partner is 6'9" tall. History of macrosomic newborn and no previous vaginal delivery. Patient would like to consider VBAC. Recommend she discuss this with MDs  Infection History:   1. Lives with someone with TB or TB exposed  no  2. Patient or partner has history of genital herpes  no 3. Rash or viral illness since LMP  no 4. History of STI (GC, CT, HPV, syphilis, HIV)  no 5. History of recent travel :  no  Other pertinent information:  no     Review of Systems:10 point review of systems negative unless otherwise noted in HPI  Past Medical History:  Past Medical History:  Diagnosis Date  .  Diabetes mellitus (HCC)   . Diabetes mellitus without complication (HCC)   . Goiter 02/07/2017  . Hypertension     Past Surgical History:  Past Surgical History:  Procedure Laterality Date  . CESAREAN SECTION    . FRACTURE SURGERY      Gynecologic History: Patient's last menstrual period was 06/18/2019 (exact date).  Obstetric History: [id number]  Family History:  Family History  Problem Relation Age of Onset  . Diabetes Sister     Social History:  Social History    Socioeconomic History  . Marital status: Single    Spouse name: Not on file  . Number of children: 2  . Years of education: Not on file  . Highest education level: 12th grade  Occupational History  . Not on file  Social Needs  . Financial resource strain: Somewhat hard  . Food insecurity    Worry: Often true    Inability: Never true  . Transportation needs    Medical: No    Non-medical: No  Tobacco Use  . Smoking status: Never Smoker  . Smokeless tobacco: Never Used  Substance and Sexual Activity  . Alcohol use: Never    Frequency: Never  . Drug use: Never  . Sexual activity: Yes    Birth control/protection: None  Lifestyle  . Physical activity    Days per week: Not on file    Minutes per session: Not on file  . Stress: Not on file  Relationships  . Social Musician on phone: Not on file    Gets together: Not on file    Attends religious service: Not on file    Active member of club or organization: Not on file    Attends meetings of clubs or organizations: Not on file    Relationship status: Not on file  . Intimate partner violence    Fear of current or ex partner: No    Emotionally abused: No    Physically abused: No    Forced sexual activity: No  Other Topics Concern  . Not on file  Social History Narrative   Food stamps, helping somewhat but not quite enough. Also supporting her two children under the age of 60    Allergies:  No Known Allergies  Medications: Prior to Admission medications   Medication Sig Start Date End Date Taking? Authorizing Provider  hydrochlorothiazide (MICROZIDE) 12.5 MG capsule Take 1 capsule (12.5 mg total) by mouth daily. 06/27/19  Yes Virl Axe, MD  Insulin Glargine (LANTUS SOLOSTAR) 100 UNIT/ML Solostar Pen Inject 20 Units into the skin daily. 09/11/18  Yes Dhir, Irving Burton, MD  losartan (COZAAR) 100 MG tablet Take 1 tablet (100 mg total) by mouth daily. 01/09/19  Yes Virl Axe, MD  Prenatal Vit-Fe Fumarate-FA  (PRENATAL VITAMIN PO) Take by mouth.   Yes [provider]  metFORMIN (GLUCOPHAGE) 1000 MG tablet Take 1 tablet (1,000 mg total) by mouth 2 (two) times daily with a meal. 05/09/19 06/08/19  Virl Axe, MD    Physical Exam Vitals: Blood pressure 124/74, weight 148 lb (67.1 kg), last menstrual period 06/18/2019.  General: NAD HEENT: normocephalic, anicteric Thyroid: no enlargement, no palpable nodules Pulmonary: No increased work of breathing, CTAB Cardiovascular: RRR, distal pulses 2+ Abdomen: NABS, soft, non-tender, non-distended.  Umbilicus without lesions.  No hepatomegaly, splenomegaly or masses palpable. No evidence of hernia  Genitourinary:  External: Normal external female genitalia.  Normal urethral meatus, normal Bartholin's and Skene's glands.  Vagina: Normal vaginal mucosa, no evidence of prolapse.    Cervix: Grossly normal in appearance, no bleeding, no CMT  Uterus: Non-enlarged, mobile, normal contour.    Adnexa: ovaries non-enlarged, no adnexal masses  Rectal: deferred Extremities: no edema, erythema, or tenderness Neurologic: Grossly intact Psychiatric: mood appropriate, affect full   Assessment: 33 y.o. G3P2002 at [redacted]w[redacted]d by LMP presenting to initiate prenatal care  Plan: 1) Avoid alcoholic beverages. 2) Patient encouraged not to smoke.  3) Discontinue the use of all non-medicinal drugs and chemicals.  4) Take prenatal vitamins daily.  5) Nutrition, food safety (fish, cheese advisories, and high nitrite foods) and exercise discussed. 6) Hospital and practice style discussed with cross coverage system.  7) Genetic Screening, such as with 1st Trimester Screening, cell free fetal DNA, AFP testing, and Ultrasound, as well as with amniocentesis and CVS as appropriate, is discussed with patient. At the conclusion of today's visit patient declined genetic testing 8) Patient is asked about travel to areas at risk for the [place] virus, and counseled to avoid travel  and exposure to mosquitoes or sexual partners who may have themselves been exposed to the virus. Testing is discussed, and will be ordered as appropriate.  9) Labs today: NOB panel, sickle cell screen, urine culture, aptima, baseline PIH labs 10) Return to clinic in 11 days for dating scan and rob   Rod Can, [place] 08/02/2019, 2:36 PM

## 2019-08-03 LAB — URINE CULTURE

## 2019-08-05 LAB — RPR+RH+ABO+RUB AB+AB SCR+CB...
Antibody Screen: NEGATIVE
HIV Screen 4th Generation wRfx: NONREACTIVE
Hematocrit: 37.1 % (ref 34.0–46.6)
Hemoglobin: 12.1 g/dL (ref 11.1–15.9)
Hepatitis B Surface Ag: NEGATIVE
MCH: 28.3 pg (ref 26.6–33.0)
MCHC: 32.6 g/dL (ref 31.5–35.7)
MCV: 87 fL (ref 79–97)
Platelets: 388 10*3/uL (ref 150–450)
RBC: 4.28 x10E6/uL (ref 3.77–5.28)
RDW: 12.1 % (ref 11.7–15.4)
RPR Ser Ql: NONREACTIVE
Rh Factor: POSITIVE
Rubella Antibodies, IGG: 3.99 index (ref >0.99)
Varicella zoster IgG: 189 index (ref >165)
WBC: 11.6 10*3/uL — ABNORMAL HIGH (ref 3.4–10.8)

## 2019-08-05 LAB — COMPREHENSIVE METABOLIC PANEL
ALT: 11 IU/L (ref 0–32)
AST: 17 IU/L (ref 0–40)
Albumin/Globulin Ratio: 1.5 (ref 1.2–2.2)
Albumin: 4.7 g/dL (ref 3.8–4.8)
Alkaline Phosphatase: 82 IU/L (ref 39–117)
BUN/Creatinine Ratio: 11 (ref 9–23)
BUN: 7 mg/dL (ref 6–20)
Bilirubin Total: <0.2 mg/dL (ref 0.0–1.2)
CO2: 21 mmol/L (ref 20–29)
Calcium: 10.2 mg/dL (ref 8.7–10.2)
Chloride: 97 mmol/L (ref 96–106)
Creatinine, Ser: 0.64 mg/dL (ref 0.57–1.00)
GFR calc Af Amer: 136 mL/min/{1.73_m2} (ref >59)
GFR calc non Af Amer: 118 mL/min/{1.73_m2} (ref >59)
Globulin, Total: 3.2 g/dL (ref 1.5–4.5)
Glucose: 101 mg/dL — ABNORMAL HIGH (ref 65–99)
Potassium: 3.5 mmol/L (ref 3.5–5.2)
Sodium: 136 mmol/L (ref 134–144)
Total Protein: 7.9 g/dL (ref 6.0–8.5)

## 2019-08-05 LAB — HEMOGLOBINOPATHY EVALUATION
HGB C: 0 %
HGB S: 0 %
HGB VARIANT: 0 %
Hemoglobin A2 Quantitation: 2.1 % (ref 1.8–3.2)
Hemoglobin F Quantitation: 1.9 % (ref 0.0–2.0)
Hgb A: 96 % — ABNORMAL LOW (ref 96.4–98.8)

## 2019-08-07 ENCOUNTER — Other Ambulatory Visit: Payer: Self-pay

## 2019-08-07 ENCOUNTER — Other Ambulatory Visit: Payer: Medicaid Other

## 2019-08-07 DIAGNOSIS — E119 Type 2 diabetes mellitus without complications: Secondary | ICD-10-CM

## 2019-08-08 LAB — COMPREHENSIVE METABOLIC PANEL
ALT: 10 IU/L (ref 0–32)
AST: 16 IU/L (ref 0–40)
Albumin/Globulin Ratio: 1.3 (ref 1.2–2.2)
Albumin: 3.9 g/dL (ref 3.8–4.8)
Alkaline Phosphatase: 69 IU/L (ref 39–117)
BUN/Creatinine Ratio: 16 (ref 9–23)
BUN: 9 mg/dL (ref 6–20)
Bilirubin Total: 0.3 mg/dL (ref 0.0–1.2)
CO2: 22 mmol/L (ref 20–29)
Calcium: 9.2 mg/dL (ref 8.7–10.2)
Chloride: 102 mmol/L (ref 96–106)
Creatinine, Ser: 0.56 mg/dL — ABNORMAL LOW (ref 0.57–1.00)
GFR calc Af Amer: 142 mL/min/{1.73_m2} (ref >59)
GFR calc non Af Amer: 123 mL/min/{1.73_m2} (ref >59)
Globulin, Total: 3 g/dL (ref 1.5–4.5)
Glucose: 119 mg/dL — ABNORMAL HIGH (ref 65–99)
Potassium: 3.9 mmol/L (ref 3.5–5.2)
Sodium: 137 mmol/L (ref 134–144)
Total Protein: 6.9 g/dL (ref 6.0–8.5)

## 2019-08-08 LAB — LIPID PANEL
Chol/HDL Ratio: 2.8 ratio (ref 0.0–4.4)
Cholesterol, Total: 159 mg/dL (ref 100–199)
HDL: 57 mg/dL (ref >39)
LDL Chol Calc (NIH): 91 mg/dL (ref 0–99)
Triglycerides: 53 mg/dL (ref 0–149)
VLDL Cholesterol Cal: 11 mg/dL (ref 5–40)

## 2019-08-08 LAB — HEMOGLOBIN A1C
Est. average glucose Bld gHb Est-mCnc: 174 mg/dL
Hgb A1c MFr Bld: 7.7 % — ABNORMAL HIGH (ref 4.8–5.6)

## 2019-08-12 LAB — CERVICOVAGINAL ANCILLARY ONLY
Chlamydia: NEGATIVE
Comment: NEGATIVE
Comment: NEGATIVE
Comment: NORMAL
Neisseria Gonorrhea: NEGATIVE
Trichomonas: NEGATIVE

## 2019-08-13 ENCOUNTER — Other Ambulatory Visit: Payer: Self-pay

## 2019-08-13 ENCOUNTER — Ambulatory Visit (INDEPENDENT_AMBULATORY_CARE_PROVIDER_SITE_OTHER): Payer: Medicaid Other | Admitting: Certified Nurse Midwife

## 2019-08-13 ENCOUNTER — Ambulatory Visit (INDEPENDENT_AMBULATORY_CARE_PROVIDER_SITE_OTHER): Payer: Medicaid Other

## 2019-08-13 VITALS — BP 152/80 | Wt 142.0 lb

## 2019-08-13 DIAGNOSIS — N8312 Corpus luteum cyst of left ovary: Secondary | ICD-10-CM

## 2019-08-13 DIAGNOSIS — O10911 Unspecified pre-existing hypertension complicating pregnancy, first trimester: Secondary | ICD-10-CM

## 2019-08-13 DIAGNOSIS — O0990 Supervision of high risk pregnancy, unspecified, unspecified trimester: Secondary | ICD-10-CM

## 2019-08-13 DIAGNOSIS — Z3A08 8 weeks gestation of pregnancy: Secondary | ICD-10-CM | POA: Diagnosis not present

## 2019-08-13 DIAGNOSIS — O3481 Maternal care for other abnormalities of pelvic organs, first trimester: Secondary | ICD-10-CM

## 2019-08-13 DIAGNOSIS — O24111 Pre-existing diabetes mellitus, type 2, in pregnancy, first trimester: Secondary | ICD-10-CM | POA: Insufficient documentation

## 2019-08-13 DIAGNOSIS — O34219 Maternal care for unspecified type scar from previous cesarean delivery: Secondary | ICD-10-CM

## 2019-08-13 DIAGNOSIS — O24119 Pre-existing diabetes mellitus, type 2, in pregnancy, unspecified trimester: Secondary | ICD-10-CM | POA: Insufficient documentation

## 2019-08-13 DIAGNOSIS — O10919 Unspecified pre-existing hypertension complicating pregnancy, unspecified trimester: Secondary | ICD-10-CM

## 2019-08-13 LAB — POCT URINALYSIS DIPSTICK OB
Glucose, UA: NEGATIVE
POC,PROTEIN,UA: NEGATIVE

## 2019-08-13 MED ORDER — LANTUS SOLOSTAR 100 UNIT/ML ~~LOC~~ SOPN: 24.0000 [IU] | PEN_INJECTOR | Freq: Every day | SUBCUTANEOUS | 3 refills | Status: DC

## 2019-08-13 MED ORDER — DOXYLAMINE-PYRIDOXINE 10-10 MG PO TBEC: 2.0000 | DELAYED_RELEASE_TABLET | Freq: Every day | ORAL | 5 refills | Status: DC

## 2019-08-13 MED ORDER — LABETALOL HCL 100 MG PO TABS: 100.0000 mg | ORAL_TABLET | Freq: Two times a day (BID) | ORAL | 6 refills | Status: DC

## 2019-08-13 NOTE — Progress Notes (Signed)
Dating scan/ ROB at 8 weeks: HROB due to [facility] and T2DM on insulin  Dating scan: CRL 8wk5d with FCA 168. Sure of LMP, wll keep EDC at 03/24/2020 by LMP  Having some nausea, vomited once. Given samples of Bonjesta and will call in [place]. Discussed how to take. CHTN: Stopped Losartan with her +LMP, still taking HCTZ. BP today 152/80  Stop HCTZ. Start labetalol 100 mgm BID T2DM: Taking 18U Lantus at hs and Metformin 500 mgm BID (patient states she takes one tablet BID or 1000 gm daily)  Hemoglobin A1C 7.7%. Reports her fasting blood sugars are in the low 100s, and before supper they are 150s-160s  Discussed with DR Georgianne Fick: Increase Lantus to 24U at hs. Start checking CBGS qid (fasting and 2 hours after meals) and keeping a log  Will get appointment with MFM to co-manage in next 1-2 weeks.. ROB in 1 week with log and for blood pressure check. Lifestyles consult for diet in pregnancy . [name], CNM

## 2019-08-13 NOTE — Progress Notes (Signed)
C/O n/v - six lb loss.rj

## 2019-08-14 ENCOUNTER — Encounter: Payer: Self-pay | Admitting: Internal Medicine

## 2019-08-14 ENCOUNTER — Other Ambulatory Visit: Payer: Self-pay

## 2019-08-14 ENCOUNTER — Ambulatory Visit: Payer: Self-pay | Admitting: Internal Medicine

## 2019-08-14 VITALS — BP 129/89 | HR 88 | Temp 97.3°F | Ht 60.0 in | Wt 140.8 lb

## 2019-08-14 DIAGNOSIS — E119 Type 2 diabetes mellitus without complications: Secondary | ICD-10-CM

## 2019-08-14 DIAGNOSIS — I10 Essential (primary) hypertension: Secondary | ICD-10-CM

## 2019-08-14 NOTE — Progress Notes (Signed)
   Subjective:    Patient ID: Susa Griffins, female    DOB: May 26, 1986, 33 y.o.   MRN: [id number]  HPI  Patient is a 33 year old female presenting for a telephonic visit. Pt states that she is pregnant with her third child.   Review of Systems  Patient Active Problem List   Diagnosis Date Noted  . Chronic hypertension affecting pregnancy 08/13/2019  . Previous cesarean delivery affecting pregnancy, antepartum 08/13/2019  . Pre-existing type 2 diabetes mellitus during pregnancy in first trimester 08/13/2019  . Supervision of high risk pregnancy, antepartum 08/01/2019  . Type 2 diabetes mellitus with hyperglycemia, without long-term current use of insulin ([place]) 09/11/2018  . Goiter 02/07/2017  . Hypertension 01/03/2017  . Diabetes mellitus, new onset ([place]) 11/14/2016     Allergies as of 08/14/2019   No Known Allergies     Medication List       Accurate as of August 14, 2019  9:18 AM. If you have any questions, ask your nurse or doctor.        Doxylamine-Pyridoxine 10-10 MG Tbec Commonly known as: Diclegis Take 2 tablets by mouth at bedtime. If symptoms persist, add one tablet in the morning and one in the afternoon   labetalol 100 MG tablet Commonly known as: NORMODYNE Take 1 tablet (100 mg total) by mouth 2 (two) times daily.   Lantus SoloStar 100 UNIT/ML Solostar Pen Generic drug: Insulin Glargine Inject 24 Units into the skin at bedtime.   metFORMIN 1000 MG tablet Commonly known as: Glucophage Take 1 tablet (1,000 mg total) by mouth 2 (two) times daily with a meal.   PRENATAL VITAMIN PO Take by mouth.          Objective:   Physical Exam    No PE     Assessment & Plan:  1. Essential Hypertension Pt has recently become pregnant and is under OB care. BP medication changes: Ace inhibitor discontinued as it is contraindicated during pregnancy. Currently on beta blocker and diretic for control, monitored by OB group.   2. Type 2 Diabetes Pt has been  taken off Actos and is currently only on metformin and insulin. Being monitored by OB and Endocrinology group at [facility] through [facility]. A1c stable at 7.7 with goal to get closer to 6 during pregnancy.   Lab studies reviewed. Elctrolytes and renal function appear to be normal. Lipids are within normal limits.   Plan is to turn over general medical care to [facility] and support by Endocrineology group until referred back to [facility] for chronic medical problem management, suspected to be summer 2021.

## 2019-08-20 ENCOUNTER — Ambulatory Visit (INDEPENDENT_AMBULATORY_CARE_PROVIDER_SITE_OTHER): Payer: Medicaid Other | Admitting: Obstetrics and Gynecology

## 2019-08-20 ENCOUNTER — Other Ambulatory Visit: Payer: Self-pay

## 2019-08-20 VITALS — BP 136/80 | Wt 145.0 lb

## 2019-08-20 DIAGNOSIS — O10919 Unspecified pre-existing hypertension complicating pregnancy, unspecified trimester: Secondary | ICD-10-CM

## 2019-08-20 DIAGNOSIS — O34219 Maternal care for unspecified type scar from previous cesarean delivery: Secondary | ICD-10-CM

## 2019-08-20 DIAGNOSIS — O0990 Supervision of high risk pregnancy, unspecified, unspecified trimester: Secondary | ICD-10-CM

## 2019-08-20 DIAGNOSIS — O10911 Unspecified pre-existing hypertension complicating pregnancy, first trimester: Secondary | ICD-10-CM

## 2019-08-20 DIAGNOSIS — Z3A09 9 weeks gestation of pregnancy: Secondary | ICD-10-CM

## 2019-08-20 DIAGNOSIS — O0991 Supervision of high risk pregnancy, unspecified, first trimester: Secondary | ICD-10-CM

## 2019-08-20 DIAGNOSIS — O24111 Pre-existing diabetes mellitus, type 2, in pregnancy, first trimester: Secondary | ICD-10-CM

## 2019-08-20 NOTE — Progress Notes (Signed)
Routine Prenatal Care Visit  Subjective  Deanna Zimmerman is a 33 y.o. G3P2002 at [redacted]w[redacted]d being seen today for ongoing prenatal care.  She is currently monitored for the following issues for this high-risk pregnancy and has Diabetes mellitus, new onset (HCC); Hypertension; Goiter; Type 2 diabetes mellitus with hyperglycemia, without long-term current use of insulin (HCC); Supervision of high risk pregnancy, antepartum; Chronic hypertension affecting pregnancy; Previous cesarean delivery affecting pregnancy, antepartum; and Pre-existing type 2 diabetes mellitus during pregnancy in first trimester on their problem list.  ----------------------------------------------------------------------------------- Patient reports no complaints.    . Vag. Bleeding: None.   . Leaking Fluid denies.  CHTN: taking labetalol with no side effects. BP in acceptable range today. T2DM: She is tracking her BG. However, she variably checks pre-meal and post-meal values.  Some values are elevated.  Discussed checking fasting and 2 hour post-prandial values. She was provided a new sheet that might be easier to utilize to track her BG values.  ----------------------------------------------------------------------------------- The following portions of the patient's history were reviewed and updated as appropriate: allergies, current medications, past family history, past medical history, past social history, past surgical history and problem list. Problem list updated.  Objective  Blood pressure 136/80, weight 145 lb (65.8 kg), last menstrual period 06/18/2019. Pregravid weight 145 lb (65.8 kg) Total Weight Gain 0 lb (0 kg) Urinalysis: Urine Protein    Urine Glucose    Fetal Status: Fetal Heart Rate (bpm): present ([place])         General:  Alert, oriented and cooperative. Patient is in no acute distress.  Skin: Skin is warm and dry. No rash noted.   Cardiovascular: Normal heart rate noted  Respiratory: Normal respiratory  effort, no problems with respiration noted  Abdomen: Soft, gravid, appropriate for gestational age.       Pelvic:  Cervical exam deferred        Extremities: Normal range of motion.     Mental Status: Normal mood and affect. Normal behavior. Normal judgment and thought content.   Assessment   33 y.o. G3P2002 at [redacted]w[redacted]d by  03/24/2020, by Last Menstrual Period presenting for routine prenatal visit  Plan   pregnancy Problems (from 08/01/19 to present)    Problem Noted Resolved   Chronic hypertension affecting pregnancy 08/13/2019 by Farrel Conners, CNM No   Overview Signed 08/13/2019  8:56 PM by Farrel Conners, CNM    [ ]  Aspirin 81 mg daily after 12 weeks; discontinue after 36 weeks [ ]  baseline labs with CBC, CMP, urine protein/creatinine ratio [ ]  no BP meds unless BPs become elevated [ ]  ultrasound for growth at 28, 32, 36 weeks [ ]  Aspirin 81 mg daily after 12 weeks; discontinue after 36 weeks [ ]  Baseline EKG   Current antihypertensives:  Labetalol 100 mgm BID (Losartan and HCTZ were discontinued   Baseline and surveillance labs (pulled in from [facility], refresh links as needed)  Lab Results  Component Value Date   PLT 388 08/01/2019   CREATININE 0.56 (L) 08/07/2019   AST 16 08/07/2019   ALT 10 08/07/2019    Antenatal Testing CHTN - O10.919  Group I  BP < 140/90, no preeclampsia, AGA,  nml AFV, +/- meds    Group II BP > 140/90, on meds, no preeclampsia, AGA, nml AFV  20-28-34-38  20-24-28-32-35-38  32//2 x wk  28//BPP wkly then 32//2 x wk  40 no meds; 39 meds  PRN or 37  Pre-eclampsia  GHTN - O13.9/Preeclampsia without severe features  -  O14.00   Preeclampsia with severe features - O14.10  Q 3-4wks  Q 2 wks  28//BPP wkly then 32//2 x wk  Inpatient  37  PRN or 34        Previous cesarean delivery affecting pregnancy, antepartum 08/13/2019 by Farrel ConnersGutierrez, Colleen, CNM No   Overview Signed 08/13/2019  8:29 PM by Farrel ConnersGutierrez, Colleen, CNM    Hx of 2  Cesarean sections      Pre-existing type 2 diabetes mellitus during pregnancy in first trimester 08/13/2019 by Farrel ConnersGutierrez, Colleen, CNM No   Overview Addendum 08/20/2019 12:18 PM by Conard NovakJackson, Gisell Buehrle D, MD    Current Diabetic Medications:  Lantus Insulin,  Metformin    Long-acting   * Lantus 24 units    * Metformin [x ] Aspirin 81 mg daily after 12 weeks; discontinue after 36 weeks (? A2/B GDM)  Required Referrals for A1GDM or A2GDM: [ ]  Diabetes Education and Testing Supplies [ ]  Nutrition Cousult  For A2/B GDM or higher classes of DM [x ] Diabetes Education and Testing Supplies [x ] Nutrition Counsult [x ] Fetal ECHO after 22-24 weeks  [x ] Eye exam for retina evaluation  [x ] Baseline EKG [ x] US fetal growth every 4 weeks starting at 28 weeks [x ] Twice weekly NST starting at [redacted] weeks gestation [x ] Delivery planning contingent on fetal growth, AFI, glycemic control, and other co-morbidities but at least by 39 weeks  Baseline and surveillance labs ([facility], refresh links as needed)  Lab Results  Component Value Date   CREATININE 0.56 (L) 08/07/2019   AST 16 08/07/2019   ALT 10 08/07/2019   TSH 0.841 09/26/2018   Lab Results  Component Value Date   HGBA1C 7.7 (H) 08/07/2019   HGBA1C 7.7 (H) 05/29/2019   HGBA1C 8.8 (H) 04/10/2019    Antenatal Testing Class of DM U/S NST/AFI DELIVERY  Diabetes   A1 - good control - O24.410    A2 - good control - O24.419      A2  - poor control or poor compliance - O24.419, E11.65   (Macrosomia or polyhydramnios) **E11.65 is extra code for poor control**    A2/B - O24.919  and B-C O24.319  Poor control B-C or D-R-F-T - O24.319  or  Type I DM - O24.019  20-38  20-38  20-24-28-32-36   20-24-28-32-35-38//fetal echo  20-24-27-30-33-36-38//fetal echo  40  32//2 x wk  32//2 x wk   32//2 x wk  28//BPP wkly then 32//2 x wk  40  39  PRN   39  PRN   Has Bionime glucometer 10/14 hemoglobin A1c was  7.7%      Supervision of high risk pregnancy, antepartum 08/01/2019 by Tresea MallGledhill, Jane, CNM No   Overview Addendum 08/13/2019  8:47 PM by Farrel ConnersGutierrez, Colleen, CNM    Clinic [facility] Prenatal Labs  Dating  Blood type:   O POSITIVE  Genetic Screen 1 Screen:    AFP:     Quad:     NIPS: Antibody: negative  Anatomic US  Rubella:   Immune Varicella: Immue  GTT Early:               Third trimester:  RPR:   negative  Rhogam  HBsAg:   negative  Vaccines TDAP:                       Flu Shot: HIV:   non reactive  Baby Food Formula  GBS:   Contraception  Pap: at [facility] June 2020, neg per pt report  CBB   Hemoglobin A1C 7.7% on 10/14  CS/VBAC 2006 primary for FTP 8#, 2009 repeat 9#10oz   Support Person Partner Sonia Side- 6'9" tall              Preterm labor symptoms and general obstetric precautions including but not limited to vaginal bleeding, contractions, leaking of fluid and fetal movement were reviewed in detail with the patient. Please refer to After Visit Summary for other counseling recommendations.   - Follow up 1 week for BG log check - will need to verify she is up to date on eye exam - needs EKG, if not already done.   Return in about 1 week (around 08/27/2019) for Routine Prenatal Appointment/BG log check.  Prentice Docker, MD, [facility], [facility] 08/21/2019 10:44 AM

## 2019-08-21 ENCOUNTER — Encounter: Payer: Self-pay | Admitting: Obstetrics and Gynecology

## 2019-08-22 ENCOUNTER — Other Ambulatory Visit: Payer: Self-pay

## 2019-08-22 ENCOUNTER — Ambulatory Visit
Admission: RE | Admit: 2019-08-22 | Discharge: 2019-08-22 | Disposition: A | Discharge status: Home / Self Care | Payer: Medicaid Other | Source: Ambulatory Visit | Attending: Maternal & Fetal Medicine | Admitting: Maternal & Fetal Medicine

## 2019-08-22 DIAGNOSIS — Z794 Long term (current) use of insulin: Secondary | ICD-10-CM | POA: Diagnosis not present

## 2019-08-22 DIAGNOSIS — Z79899 Other long term (current) drug therapy: Secondary | ICD-10-CM | POA: Insufficient documentation

## 2019-08-22 DIAGNOSIS — O24911 Unspecified diabetes mellitus in pregnancy, first trimester: Secondary | ICD-10-CM | POA: Diagnosis present

## 2019-08-22 DIAGNOSIS — E119 Type 2 diabetes mellitus without complications: Secondary | ICD-10-CM | POA: Diagnosis not present

## 2019-08-22 DIAGNOSIS — O24111 Pre-existing diabetes mellitus, type 2, in pregnancy, first trimester: Secondary | ICD-10-CM | POA: Insufficient documentation

## 2019-08-22 DIAGNOSIS — Z3A09 9 weeks gestation of pregnancy: Secondary | ICD-10-CM | POA: Diagnosis not present

## 2019-08-22 LAB — GLUCOSE, CAPILLARY: Glucose-Capillary: 132 mg/dL — ABNORMAL HIGH (ref 70–99)

## 2019-08-22 NOTE — Progress Notes (Addendum)
[facility] Consultation    HPI: Deanna Zimmerman is a 33 y.o. G3P2002 at [redacted]w[redacted]d by who presents in consultation from  for Type 2 DM and cHTN (on labetalol 100mg  bid). She reports being diagnosed with DM and cHTN simultaneously in 2018 when she developed diaphoresis and tachycardia one evening that prompted her to call ambulance.  She was diagnosed with DM in the ED, discharged fromt he ED and  and subsequently made an appt with [facility] and has followed with her primary care MD there since that time. She was initially placed on metformin alone and subsequently began insulin therapy.  She reports no history of end organ damage. [facility] and has been checking blood glucose values and is aware of targets.  She has good glycemic control on current regimen. (Lantus 28u at HS) and metformin 2000mg  daily. From 10/24-today fastings 73- 78 Postprandials 105-133 (one value of 133/out of range)  Past Medical History: Type 2 DM, as above CHTN, as above  Past Surgical History: She  has a past surgical history that includes Cesarean section and Fracture surgery.  Obstetric History:  OB History    Gravida  3   Para  2   Term  2   Preterm      AB      Living  2     SAB      TAB      Ectopic      Multiple      Live Births  2         2006, 7 lb, female, no complications during pregnancy, reports cs due to arrest of dilation 2009, 7lb, female, female, no complications repeat c/s  Social history Works in 2007, no tobacco, etoh, or ilicit drugs  Family history No history of birth defects Family History  Problem Relation Age of Onset  . Diabetes Sister   . Diabetes Paternal Aunt     Current Outpatient Medications on File Prior to Encounter  Medication Sig Dispense Refill  . Doxylamine-Pyridoxine (DICLEGIS) 10-10 MG TBEC Take 2 tablets by mouth at bedtime. If symptoms persist, add one tablet in the morning and one in the  afternoon 100 tablet 5  . Insulin Glargine (LANTUS SOLOSTAR) 100 UNIT/ML Solostar Pen Inject 24 Units into the skin at bedtime. 15 mL 3  . labetalol (NORMODYNE) 100 MG tablet Take 1 tablet (100 mg total) by mouth 2 (two) times daily. 60 tablet 6  . metFORMIN (GLUCOPHAGE) 1000 MG tablet Take 1 tablet (1,000 mg total) by mouth 2 (two) times daily with a meal. 60 tablet 2  . Prenatal Vit-Fe Fumarate-FA (PRENATAL VITAMIN PO) Take by mouth.     No current facility-administered medications on file prior to encounter.    Allergies: Patient has No Known Allergies.   Physical Exam: BP 129/86   Pulse 100   Temp 98 F (36.7 C) (Temporal)   Resp 20   Ht 5' (1.524 m)   Wt 66 kg   LMP 06/18/2019 (Exact Date)   SpO2 100%   BMI 28.42 kg/m   Lab Results  Component Value Date   WBC 11.6 (H) 08/01/2019   HGB 12.1 08/01/2019   HCT 37.1 08/01/2019   MCV 87 08/01/2019   PLT 388 08/01/2019   Lab Results  Component Value Date   CREATININE 0.56 (L) 08/07/2019   Lab Results  Component Value Date   ALT 10 08/07/2019   AST 16 08/07/2019  ALKPHOS 69 08/07/2019   BILITOT 0.3 08/07/2019   Lab Results  Component Value Date   TSH 0.841 09/26/2018    Plan: 1. Type 2 DM Concerns related to diabetes in pregnancy are largely due to complications associated with poor glycemic control. Early fetal concerns are related to possible fetal malformations---cardiac malformations are the a major malformation associated with poor preconception glycemic control. The better the preconception glycemic control, the lower the risk for fetal birth defects.  Later in gestation, hyperglycemia may lead to fetal macrosomia, increased operative delivery/birth injury, intrauterine fetal demise and and post natal metabolic derangements/need for neonatal intensive care unit management of hypoglycemia.  Close monitoring and management of glycemic control can decrease this risks.  Additionally, pregestational diabetes  increases the risk for conditions such as preeclampsia.  --Recommend Baseline maternal evaluations include evaluation for end organ dysfunction with: TSH (up to date, as above), ECG, p:c ratio, opthalmology evaluation (she reports normal exam last year and is planning to have ophthalmology apt) --Baseline labs obtained early in pregnancy should also include: CBC, lfts, creatinine (normal, as above) --We addressed pregnancy surveillance with 4x daily glucose monitoring (targets during pregnacy: fasting <95, 1h post prandial blood glucose values less than 120 and 2h less than 130).  --Midtrimester detailed anatomic survey at ~18 weeks --fetal echocardiogram at ~20-22 weeks -- monthly growth surveillance beginning at 28 weeks followed by weekly antenatal testing beginning at 32 weeks and twice weekly testing beginning at 34 weeks. --delivery 37-39 weeks, based on glycemic control --her current glycemic control is excellent. She will call for BG review in 2 weeks and will RTC in 4 weeks for MFM consult/BG review. And in 10 weeks for anatomic survey/ MFM consult --- We discussed comanagement of her care and possible need for transfer of care if difficulty managing DM or HTN or other issues develop during preganncy  2. CHTN  We reviewed lack of teratogenicity related to her first trimester medications and common, safe use of her current medication (labetalol) We reviewed risks associated with superimposed preeclampsia.  In addition, cHTN is associated with growth restriction/placental insufficiency.  --recommend low dose 'baby' aspirin daily to reduce the risk for preeclampsia --antenatal testing and surveillance as per T2DM guidelines  3. Previous c/s x 2--she reports no history of classical or of being told could not labor in future.  Op reports needed for review.  If ltcs x 2 we briefly addressed the possibility of TOLAC but possible need for TOC if desires TOLAC in that setting.  Can discuss further  after op note review and pregnancy progress. Will try to obtain op notes for review.   Total time spent with the patient was 30 minutes with greater than 50% spent in counseling and coordination of care. We appreciate this interesting consult and will be happy to be involved in the ongoing care of [name] in anyway her obstetricians desire.  Manfred Shirts, MD [facility]

## 2019-08-27 ENCOUNTER — Encounter: Payer: Self-pay | Admitting: Dietician

## 2019-08-27 ENCOUNTER — Other Ambulatory Visit: Payer: Self-pay

## 2019-08-27 ENCOUNTER — Encounter: Payer: Medicaid Other | Attending: Certified Nurse Midwife | Admitting: Dietician

## 2019-08-27 ENCOUNTER — Ambulatory Visit (INDEPENDENT_AMBULATORY_CARE_PROVIDER_SITE_OTHER): Payer: Medicaid Other | Admitting: Advanced Practice Midwife

## 2019-08-27 ENCOUNTER — Encounter: Payer: Self-pay | Admitting: Advanced Practice Midwife

## 2019-08-27 VITALS — BP 134/84 | Wt 144.0 lb

## 2019-08-27 VITALS — BP 138/92 | Ht 60.0 in | Wt 143.2 lb

## 2019-08-27 DIAGNOSIS — O34219 Maternal care for unspecified type scar from previous cesarean delivery: Secondary | ICD-10-CM

## 2019-08-27 DIAGNOSIS — E118 Type 2 diabetes mellitus with unspecified complications: Secondary | ICD-10-CM | POA: Insufficient documentation

## 2019-08-27 DIAGNOSIS — O24111 Pre-existing diabetes mellitus, type 2, in pregnancy, first trimester: Secondary | ICD-10-CM

## 2019-08-27 DIAGNOSIS — Z7984 Long term (current) use of oral hypoglycemic drugs: Secondary | ICD-10-CM | POA: Diagnosis not present

## 2019-08-27 DIAGNOSIS — O10911 Unspecified pre-existing hypertension complicating pregnancy, first trimester: Secondary | ICD-10-CM

## 2019-08-27 DIAGNOSIS — Z3A10 10 weeks gestation of pregnancy: Secondary | ICD-10-CM | POA: Diagnosis not present

## 2019-08-27 DIAGNOSIS — E119 Type 2 diabetes mellitus without complications: Secondary | ICD-10-CM

## 2019-08-27 NOTE — Progress Notes (Signed)
Routine Prenatal Care Visit  Subjective  Deanna Zimmerman is a 33 y.o. G3P2002 at 6433w0d being seen today for ongoing prenatal care.  She is currently monitored for the following issues for this high-risk pregnancy and has Diabetes mellitus, new onset (HCC); Hypertension; Goiter; Type 2 diabetes mellitus with hyperglycemia, without long-term current use of insulin (HCC); Supervision of high risk pregnancy, antepartum; Chronic hypertension affecting pregnancy; Previous cesarean delivery affecting pregnancy, antepartum; and Pre-existing type 2 diabetes mellitus during pregnancy in first trimester on their problem list.  ----------------------------------------------------------------------------------- Patient reports no complaints.  We reviewed her blood glucose log and recommendations  . Vag. Bleeding: None.   . Leaking Fluid denies.  ----------------------------------------------------------------------------------- The following portions of the patient's history were reviewed and updated as appropriate: allergies, current medications, past family history, past medical history, past social history, past surgical history and problem list. Problem list updated.  Objective  Blood pressure 134/84, weight 144 lb (65.3 kg), last menstrual period 06/18/2019. Pregravid weight 145 lb (65.8 kg) Total Weight Gain -1 lb (-0.454 kg) Urinalysis: Urine Protein    Urine Glucose     Blood Glucose Log: Fasting all normal in 70s, after breakfast all normal with the exception of 1 value of 121, after lunch all normal with the exception of 1 value of 124, after dinner 3 elevated in 120s-150s. She admits having sweet drinks at dinner.  Fetal Status: Fetal Heart Rate (bpm): 163         General:  Alert, oriented and cooperative. Patient is in no acute distress.  Skin: Skin is warm and dry. No rash noted.   Cardiovascular: Normal heart rate noted  Respiratory: Normal respiratory effort, no problems with  respiration noted  Abdomen: Soft, gravid, appropriate for gestational age.       Pelvic:  Cervical exam deferred        Extremities: Normal range of motion.     Mental Status: Normal mood and affect. Normal behavior. Normal judgment and thought content.   Assessment   33 y.o. G3P2002 at 1633w0d by  03/24/2020, by Last Menstrual Period presenting for routine prenatal visit  Plan   pregnancy Problems (from 08/01/19 to present)    Problem Noted Resolved   Chronic hypertension affecting pregnancy 08/13/2019 by Farrel ConnersGutierrez, Colleen, CNM No   Overview Signed 08/13/2019  8:56 PM by Farrel ConnersGutierrez, Colleen, CNM    [ ]  Aspirin 81 mg daily after 12 weeks; discontinue after 36 weeks [ ]  baseline labs with CBC, CMP, urine protein/creatinine ratio [ ]  no BP meds unless BPs become elevated [ ]  ultrasound for growth at 28, 32, 36 weeks [ ]  Aspirin 81 mg daily after 12 weeks; discontinue after 36 weeks [ ]  Baseline EKG   Current antihypertensives:  Labetalol 100 mgm BID (Losartan and HCTZ were discontinued   Baseline and surveillance [facility] refresh links as needed)  Lab Results  Component Value Date   PLT 388 08/01/2019   CREATININE 0.56 (L) 08/07/2019   AST 16 08/07/2019   ALT 10 08/07/2019    Antenatal Testing CHTN - O10.919  Group I  BP < 140/90, no preeclampsia, AGA,  nml AFV, +/- meds    Group II BP > 140/90, on meds, no preeclampsia, AGA, nml AFV  20-28-34-38  20-24-28-32-35-38  32//2 x wk  28//BPP wkly then 32//2 x wk  40 no meds; 39 meds  PRN or 37  Pre-eclampsia  GHTN - O13.9/Preeclampsia without severe features  - O14.00   Preeclampsia with  severe features - O14.10  Q 3-4wks  Q 2 wks  28//BPP wkly then 32//2 x wk  Inpatient  37  PRN or 34        Previous cesarean delivery affecting pregnancy, antepartum 08/13/2019 by Farrel Conners, CNM No   Overview Signed 08/13/2019  8:29 PM by Farrel Conners, CNM    Hx of 2 Cesarean sections       Pre-existing type 2 diabetes mellitus during pregnancy in first trimester 08/13/2019 by Farrel Conners, CNM No   Overview Addendum 08/20/2019 12:18 PM by Conard Novak, MD    Current Diabetic Medications:  Lantus Insulin,  Metformin    Long-acting   * Lantus 24 units    * Metformin [x ] Aspirin 81 mg daily after 12 weeks; discontinue after 36 weeks (? A2/B GDM)  Required Referrals for A1GDM or A2GDM: [ ]  Diabetes Education and Testing Supplies [ ]  Nutrition Cousult  For A2/B GDM or higher classes of DM [x ] Diabetes Education and Testing Supplies [x ] Nutrition Counsult [x ] Fetal ECHO after 22-24 weeks  [x ] Eye exam for retina evaluation  [x ] Baseline EKG [ x] fetal growth every 4 weeks starting at 28 weeks [x ] Twice weekly NST starting at [redacted] weeks gestation [x ] Delivery planning contingent on fetal growth, AFI, glycemic control, and other co-morbidities but at least by 39 weeks  Baseline and surveillance labs (pulled in from [facility], refresh links as needed)  Lab Results  Component Value Date   CREATININE 0.56 (L) 08/07/2019   AST 16 08/07/2019   ALT 10 08/07/2019   TSH 0.841 09/26/2018   Lab Results  Component Value Date   HGBA1C 7.7 (H) 08/07/2019   HGBA1C 7.7 (H) 05/29/2019   HGBA1C 8.8 (H) 04/10/2019    Antenatal Testing Class of DM U/S NST/AFI DELIVERY  Diabetes   A1 - good control - O24.410    A2 - good control - O24.419      A2  - poor control or poor compliance - O24.419, E11.65   (Macrosomia or polyhydramnios) **E11.65 is extra code for poor control**    A2/B - O24.919  and B-C O24.319  Poor control B-C or D-R-F-T - O24.319  or  Type I DM - O24.019  20-38  20-38  20-24-28-32-36   20-24-28-32-35-38//fetal echo  20-24-27-30-33-36-38//fetal echo  40  32//2 x wk  32//2 x wk   32//2 x wk  28//BPP wkly then 32//2 x wk  40  39  PRN   39  PRN   Has Bionime glucometer 10/14 hemoglobin A1c was 7.7%      Supervision of  high risk pregnancy, antepartum 08/01/2019 by 11/14, CNM No   Overview Addendum 08/13/2019  8:47 PM by [name], CNM    [facility] Prenatal Labs  Dating  Blood type:   O POSITIVE  Genetic Screen 1 Screen:    AFP:     Quad:     NIPS: Antibody: negative  Anatomic 08/15/2019  Rubella:   Immune Varicella: Immue  GTT Early:               Third trimester:  RPR:   negative  Rhogam  HBsAg:   negative  Vaccines TDAP:                       Flu Shot: HIV:   non reactive  Baby Food Formula  GBS:   Contraception  Pap: at [facility] June 2020, neg per pt report  CBB   Hemoglobin A1C 7.7% on 10/14  CS/VBAC 2006 primary for FTP 8#, 2009 repeat 9#10oz   Support Person Partner Deanna Zimmerman- 6'9" tall              Preterm labor symptoms and general obstetric precautions including but not limited to vaginal bleeding, contractions, leaking of fluid and fetal movement were reviewed in detail with the patient.  Come in sooner with concerns Continue checking blood glucose as instructed Decrease consumption of sugary drinks Adjust insulin dose as needed per glucose log  Return in about 4 weeks (around 09/24/2019) for rob.  Rod Can, CNM 08/27/2019 2:51 PM

## 2019-08-27 NOTE — Progress Notes (Signed)
No vb. No lof.  

## 2019-08-27 NOTE — Progress Notes (Signed)
Diabetes Self-Management Education  Visit Type: First/Initial  Appt. Start Time: 1530 Appt. End Time: 1630  08/27/2019  Deanna Zimmerman, identified by name and date of birth, is a 33 y.o. female with a diagnosis of Diabetes: Type 2(pregnant-[redacted] weeks gestation).   ASSESSMENT  Blood pressure (!) 138/92, height 5' (1.524 m), weight 143 lb 3.2 oz (65 kg), last menstrual period 06/18/2019. Body mass index is 27.97 kg/m.  Diabetes Self-Management Education - 08/27/19 1715      Visit Information   Visit Type  First/Initial      Initial Visit   Diabetes Type  Type 2   pregnant-[redacted] weeks gestation   Date Diagnosed  2 years ago      Health Coping   How would you rate your overall health?  Good      Psychosocial Assessment   Patient Belief/Attitude about Diabetes  Motivated to manage diabetes    Self-care barriers  None    Self-management support  Doctor's office    Other persons present  Patient    Patient Concerns  Medication;Glycemic Control;Healthy Lifestyle    Special Needs  None    Preferred Learning Style  Visual;Hands on;No preference indicated;Auditory    Learning Readiness  Ready    What is the last grade level you completed in school?  12      Pre-Education Assessment   Patient understands the diabetes disease and treatment process.  Needs Review    Patient understands incorporating nutritional management into lifestyle.  Needs Review    Patient undertands incorporating physical activity into lifestyle.  Needs Review    Patient understands using medications safely.  Needs Review    Patient understands monitoring blood glucose, interpreting and using results  Needs Review    Patient understands prevention, detection, and treatment of acute complications.  Needs Review    Patient understands prevention, detection, and treatment of chronic complications.  Needs Review    Patient understands how to develop strategies to address psychosocial issues.  Needs Review    Patient understands how to develop strategies to promote health/change behavior.  Needs Review      Complications   Last HgB A1C per patient/outside source  9.9 %   08-26-19   How often do you check your blood sugar?  3-4 times/day    Fasting Blood glucose range (mg/dL)  70-129;<70    Postprandial Blood glucose range (mg/dL)  <70;130-179;70-129    Have you had a dilated eye exam in the past 12 months?  Yes   08-2018   Have you had a dental exam in the past 12 months?  No   03-2018   Are you checking your feet?  No      Dietary Intake   Breakfast  eats breakfast at 8:30-9a=works at [employer] and usually eats there=egg, pancakess, oatmeal, bacon or sausage    Snack (morning)  eats cheese and crackers or yogurt at 12p    Lunch  eats at 2p at [facility] when working=chicken tenders or hotdog and fries; eats sweets and snack foods 2-3x/wk    Snack (afternoon)  eats cheese and crackers or fruit at [employer]  eats supper at 6-7p=baked meat, vegetables or spaghetti and salad    Snack (evening)  none    Beverage(s)  drinks water 8+x/day, sweet beverages 2-3x/day, fruit juice 0-1x/day, milk 1x/day      Exercise   Exercise Type  Light (walking / raking leaves)    How many days per week  to you exercise?  4    How many minutes per day do you exercise?  60    Total minutes per week of exercise  240      Patient Education   Previous Diabetes Education  No    Disease state   Definition of diabetes, type 1 and 2, and the diagnosis of diabetes    Nutrition management   Role of diet in the treatment of diabetes and the relationship between the three main macronutrients and blood glucose level;Food label reading, portion sizes and measuring food.;Carbohydrate counting    Physical activity and exercise   Role of exercise on diabetes management, blood pressure control and cardiac health.;Helped patient identify appropriate exercises in relation to his/her diabetes, diabetes complications and other  health issue.    Medications  Taught/reviewed insulin injection, site rotation, insulin storage and needle disposal.;Reviewed patients medication for diabetes, action, purpose, timing of dose and side effects.    Monitoring  Purpose and frequency of SMBG.;Taught/discussed recording of test results and interpretation of SMBG.;Identified appropriate SMBG and/or A1C goals.    Acute complications  Taught treatment of hypoglycemia - the 15 rule.    Chronic complications  Relationship between chronic complications and blood glucose control   discussed possible complications with pregnancy due to diabetes   Preconception care  Reviewed with patient blood glucose goals with pregnancy;Role of family planning for patients with diabetes    Personal strategies to promote health  Helped patient develop diabetes management plan for (enter comment);Lifestyle issues that need to be addressed for better diabetes care      Outcomes   Expected Outcomes  Demonstrated interest in learning. Expect positive outcomes       Individualized Plan for Diabetes Self-Management Training:   Learning Objective:  Patient will have a greater understanding of diabetes self-management. Patient education plan is to attend individual and/or group sessions per assessed needs and concerns.   Plan:   Check BG 4 x per day - fasting and 2 hours after each meal Eat 3 balanced meals/day - include 2 starches with breakfast, 3 starches with lunch and supper and 1 protein with each meal Eat 3 snacks/day- include 1 serving of protein and 1 serving of starch Allow 2-3 hours between meals and snacks Avoid sugar sweetened beverages and fruit juices Drink 6-8 (8 oz) glasses of water each day Continue walking at least 30 min  5 x per week as permitted by MD Complete a 3 day food record and bring to next appointment  Expected Outcomes:  Demonstrated interest in learning. Expect positive outcomes  Education material provided: General meal  planning guidelines for pregnancy, Food Group handout, high and low BG handouts, 1 tube glucose tablets for PRN use, Diabetes Management for Mothers To Be booklet  If problems or questions, patient to contact team via:  [phone]  Future DSME appointment:  09-05-19

## 2019-09-05 ENCOUNTER — Ambulatory Visit: Payer: Self-pay | Admitting: Ophthalmology

## 2019-09-05 ENCOUNTER — Encounter: Payer: Medicaid Other | Admitting: Skilled Nursing Facility1

## 2019-09-05 ENCOUNTER — Other Ambulatory Visit: Payer: Self-pay | Admitting: Obstetrics and Gynecology

## 2019-09-05 ENCOUNTER — Other Ambulatory Visit: Payer: Self-pay

## 2019-09-05 ENCOUNTER — Encounter: Payer: Self-pay | Admitting: Obstetrics and Gynecology

## 2019-09-05 DIAGNOSIS — O24111 Pre-existing diabetes mellitus, type 2, in pregnancy, first trimester: Secondary | ICD-10-CM

## 2019-09-05 MED ORDER — INSULIN LISPRO 100 UNIT/ML ~~LOC~~ SOLN: SUBCUTANEOUS | 3 refills | Status: DC

## 2019-09-05 NOTE — Progress Notes (Unsigned)
Deanna Zimmerman is a 33 y.o. female  At 87w 2d  With T2 Dm recently started lantus 24 units qhs  Who has sent her Blood sugars in for review   FBS 48-102 ( 15/16 < 95)  2h pp breakfast   69-126 ( 1 /15 > 120)  2 h pp lunch 82-123 (2/11 > 120 ) 2 h pp dinner 95-175 (11/14 >120)  I offered 3 options  1- preferred - start humalog before supper 4 units  2 shift calories to earlier in the day  3 shift lantus to am dosing   Pt will start humalog 4 u - sent to [facility]   Gatha Mayer

## 2019-09-05 NOTE — Progress Notes (Signed)
Pt states she is 11 weeks a long. Pt states she has 2 children with this one being her third.  Pt states she has been checking her blood sugar 6 times a day by the direction of her phyisician. Fasting: 48, 68, 78,  and 2 hours after each meal: lunch: 113, 124, 73, 85, dinner: 170, 172, 175, 112, 127, 90, 156   With her Blood sugar at 48 she felt tired and did not want to eat anything; states she forced herself to drink regular soda half a 12 ounce can, then waited 2 hours to check her blood sugar again; during this time she was just sitting on the couch feeling bad: Dietitian corrected this behavior advising she check 15 minutes after she has used corrective sugar and to keep correcting if her BS has not risen then to eat a meal with protein when if has come to an appropriate number above 60; if not corrected after 3 attempts go ahead can call your doctor.  Pt states she has been on insulin since before conceiving and she has always struggling with low blood sugars typically feeling lightheaded, jittery, tingling in hands, sweaty with numbers under 100. Pt states she feels fine with blood sugars in the 70's in the morning but if her blood sugars are around 100 while at work she experiences the symptoms.  Pt states she does have the glucose tablets the nurse gave her but she forgets they are there. Pt states she does sometimes skip lunch due to work [employer] busy.  Pt states she is hungry 30 minutes after her lunch: Dietitian advised pt to have a larger lunch including non starchy vegetables and 1 serving of fruit.  Pt states she goes for walks in the evening.  Pt states she has a hx of being a picky eater and has never tried berries and other fruits and never tried raw vegetables.   24 hr recall: 3 meals 2 snacks Day sometimes skipping lunch at dinner 1 egg with 1 waffle with syrup and 1 toast (BS 82) 1/4 cup yogurt  And peanut butter Baked chicken 2 cups greens 1/4 cup mac n cheese   Goals: Hypoglycemia:  glucose tab or regular soda then recheck 15 minutes later and eat a meal with protein and carb Hyperglycemia: drink 16 ounces of water and/or go for a walk Check you feet every day looking for anything that was not there the day before If you are hungry but your BS is over 120 eat protein such as [place] or nuts, etc. Aim to have 2 serving of carb with dinner try adding peas or corn , etc. For your raw vegetables you can try hummus, ranch, or peanut butter Add non starchy vegetables and 1 serving of fruit to lunch  Diabetes Self-Management Education  Visit Type: Follow-up  09/05/2019  Ms. Natividad Brood, identified by name and date of birth, is a 33 y.o. female with a diagnosis of Diabetes: Type 2.   ASSESSMENT  Height 5' (1.524 m), weight 143 lb (64.9 kg), last menstrual period 06/18/2019. Body mass index is 27.93 kg/m.  Diabetes Self-Management Education - 09/05/19 0939      Visit Information   Visit Type  Follow-up      Initial Visit   Diabetes Type  Type 2      Health Coping   How would you rate your overall health?  Good      Psychosocial Assessment   Patient Belief/Attitude about Diabetes  Motivated to  manage diabetes      Pre-Education Assessment   Patient understands the diabetes disease and treatment process.  Demonstrates understanding / competency    Patient understands incorporating nutritional management into lifestyle.  Demonstrates understanding / competency    Patient undertands incorporating physical activity into lifestyle.  Demonstrates understanding / competency    Patient understands using medications safely.  Demonstrates understanding / competency    Patient understands monitoring blood glucose, interpreting and using results  Demonstrates understanding / competency    Patient understands prevention, detection, and treatment of acute complications.  Demonstrates understanding / competency    Patient understands prevention, detection, and treatment of  chronic complications.  Demonstrates understanding / competency    Patient understands how to develop strategies to address psychosocial issues.  Demonstrates understanding / competency    Patient understands how to develop strategies to promote health/change behavior.  Demonstrates understanding / competency      Complications   How often do you check your blood sugar?  > 4 times/day    Fasting Blood glucose range (mg/dL)  70-129    Postprandial Blood glucose range (mg/dL)  130-179    Number of hypoglycemic episodes per month  1    Can you tell when your blood sugar is low?  Yes    What do you do if your blood sugar is low?  drink soda    Number of hyperglycemic episodes per week  1    Can you tell when your blood sugar is high?  No    What do you do if your blood sugar is high?  nothing    Have you had a dental exam in the past 12 months?  Yes    Are you checking your feet?  No      Dietary Intake   Breakfast  1 egg 1 waffle with syrup and 1 toast    Snack (morning)  1/4 cup yogurt    Lunch  1 peanut butter sandwich    Snack (afternoon)  1 spoon of peanu butter and yogurt    Dinner  chicken and and greens and mac n cheese    Beverage(s)  water, unsweet tea      Exercise   Exercise Type  Light (walking / raking leaves)    How many days per week to you exercise?  30    How many minutes per day do you exercise?  3    Total minutes per week of exercise  90      Patient Education   Previous Diabetes Education  Yes (please comment)      Individualized Goals (developed by patient)   Nutrition  Follow meal plan discussed;General guidelines for healthy choices and portions discussed    Physical Activity  Exercise 3-5 times per week;30 minutes per day    Medications  take my medication as prescribed    Monitoring   send in my blood glucose log as discussed;test blood glucose pre and post meals as discussed      Patient Self-Evaluation of Goals - Patient rates self as meeting  previously set goals (% of time)   Nutrition  >75%    Physical Activity  >75%    Medications  >75%    Monitoring  >75%    Problem Solving  >75%    Reducing Risk  >75%    Health Coping  >75%      Post-Education Assessment   Patient understands the diabetes disease and treatment process.  Demonstrates  understanding / competency    Patient understands incorporating nutritional management into lifestyle.  Demonstrates understanding / competency    Patient undertands incorporating physical activity into lifestyle.  Demonstrates understanding / competency    Patient understands using medications safely.  Demonstrates understanding / competency    Patient understands monitoring blood glucose, interpreting and using results  Demonstrates understanding / competency    Patient understands prevention, detection, and treatment of acute complications.  Demonstrates understanding / competency    Patient understands prevention, detection, and treatment of chronic complications.  Demonstrates understanding / competency    Patient understands how to develop strategies to address psychosocial issues.  Demonstrates understanding / competency    Patient understands how to develop strategies to promote health/change behavior.  Demonstrates understanding / competency      Outcomes   Expected Outcomes  Demonstrated interest in learning. Expect positive outcomes    Future DMSE  PRN    Program Status  Completed      Subsequent Visit   Since your last visit have you continued or begun to take your medications as prescribed?  No    Since your last visit have you had your blood pressure checked?  No    Since your last visit have you experienced any weight changes?  No change    Since your last visit, are you checking your blood glucose at least once a day?  Yes       Individualized Plan for Diabetes Self-Management   Expected Outcomes:  Demonstrated interest in learning. Expect positive outcomes  Education  material provided: My Plate  If problems or questions, patient to contact team via:  Phone  Future DSME appointment: PRN

## 2019-09-09 ENCOUNTER — Other Ambulatory Visit: Payer: Self-pay | Admitting: Obstetrics and Gynecology

## 2019-09-09 MED ORDER — "INSULIN SYRINGE 29G X 1/2"" 0.5 ML MISC": 4.0000 [IU] | Freq: Every day | 4 refills | Status: DC

## 2019-09-09 NOTE — Progress Notes (Signed)
Syringe Rx sent in [place], Deanna Zimmerman

## 2019-09-12 ENCOUNTER — Other Ambulatory Visit: Payer: Self-pay

## 2019-09-12 ENCOUNTER — Ambulatory Visit
Admission: RE | Admit: 2019-09-12 | Discharge: 2019-09-12 | Disposition: A | Discharge status: Home / Self Care | Payer: Medicaid Other | Source: Ambulatory Visit | Attending: Maternal & Fetal Medicine | Admitting: Maternal & Fetal Medicine

## 2019-09-12 DIAGNOSIS — O10011 Pre-existing essential hypertension complicating pregnancy, first trimester: Secondary | ICD-10-CM | POA: Diagnosis not present

## 2019-09-12 DIAGNOSIS — O34219 Maternal care for unspecified type scar from previous cesarean delivery: Secondary | ICD-10-CM | POA: Insufficient documentation

## 2019-09-12 DIAGNOSIS — E119 Type 2 diabetes mellitus without complications: Secondary | ICD-10-CM | POA: Diagnosis not present

## 2019-09-12 DIAGNOSIS — Z833 Family history of diabetes mellitus: Secondary | ICD-10-CM | POA: Diagnosis not present

## 2019-09-12 DIAGNOSIS — Z79899 Other long term (current) drug therapy: Secondary | ICD-10-CM | POA: Diagnosis not present

## 2019-09-12 DIAGNOSIS — Z3A12 12 weeks gestation of pregnancy: Secondary | ICD-10-CM | POA: Diagnosis not present

## 2019-09-12 DIAGNOSIS — O0990 Supervision of high risk pregnancy, unspecified, unspecified trimester: Secondary | ICD-10-CM

## 2019-09-12 DIAGNOSIS — O10919 Unspecified pre-existing hypertension complicating pregnancy, unspecified trimester: Secondary | ICD-10-CM

## 2019-09-12 DIAGNOSIS — O24111 Pre-existing diabetes mellitus, type 2, in pregnancy, first trimester: Secondary | ICD-10-CM | POA: Diagnosis present

## 2019-09-12 DIAGNOSIS — Z794 Long term (current) use of insulin: Secondary | ICD-10-CM | POA: Diagnosis not present

## 2019-09-12 LAB — GLUCOSE, CAPILLARY: Glucose-Capillary: 78 mg/dL (ref 70–99)

## 2019-09-12 NOTE — ED Notes (Signed)
Pt to appt at [facility] today for consult with MFM-Dr. Small.  Pt c/o headache, elevated BP on arrival will recheck.  Pt does not c/o of any other s/s today.

## 2019-09-12 NOTE — Progress Notes (Signed)
[facility] Follow up Consultation    HPI: Deanna Zimmerman is a 33 y.o. G3P2002 at  12w 2 d who presents in consultation from  for glycemic review due to T2 DM. She is currently taking Lantus 24u at HS and 4 u of Humalog with dinner and metformin 1000mg  bid.  She started humalog last week due to elevated dinner values.  She is here today with her partner, .   She is on labetalol 100mg  bid for cHTN.  She reports bps 130s/80s at home.   Blood glucose log From 11/13-current fastings 65, 77, 67, 125, 76, 55 Post breakfast: 79, 98, 131, 78, 67, 90 Post lunch: 108, 102, 131, 91, 110, 101 Post dinner: 122, 108, 127, 110, 130  Past Medical History: Patient  has a past medical history of Diabetes mellitus (HCC), Diabetes mellitus without complication (HCC), Goiter (02/07/2017), and Hypertension.  Past Surgical History: She  has a past surgical history that includes Cesarean section and Fracture surgery.  Obstetric History:  OB History    Gravida  3   Para  2   Term  2   Preterm      AB      Living  2     SAB      TAB      Ectopic      Multiple      Live Births  2          Current Outpatient Medications on File Prior to Encounter  Medication Sig Dispense Refill  . Doxylamine-Pyridoxine (DICLEGIS) 10-10 MG TBEC Take 2 tablets by mouth at bedtime. If symptoms persist, add one tablet in the morning and one in the afternoon (Patient not taking: Reported on 08/27/2019) 100 tablet 5  . Insulin Glargine (LANTUS SOLOSTAR) 100 UNIT/ML Solostar Pen Inject 24 Units into the skin at bedtime. 15 mL 3  . insulin lispro (HUMALOG) 100 UNIT/ML injection 4 units before supper 10 mL 3  . INSULIN SYRINGE .5CC/29G 29G X 1/2" 0.5 ML MISC 4 Units by Does not apply route daily before supper. 30 each 4  . labetalol (NORMODYNE) 100 MG tablet Take 1 tablet (100 mg total) by mouth 2 (two) times daily. (Patient taking differently: Take 200 mg by mouth 2 (two) times daily. Dose  changed on 11/19 to 200 mg twice a day) 60 tablet 6  . metFORMIN (GLUCOPHAGE) 1000 MG tablet Take 1 tablet (1,000 mg total) by mouth 2 (two) times daily with a meal. 60 tablet 2  . metFORMIN (GLUCOPHAGE) 1000 MG tablet Take 1,000 mg by mouth 2 (two) times daily with a meal.    . Prenatal Vit-Fe Fumarate-FA (PRENATAL VITAMIN PO) Take by mouth.     No current facility-administered medications on file prior to encounter.    Allergies: Patient has No Known Allergies.  Social History: Patient  reports that she has never smoked. She has never used smokeless tobacco. She reports that she does not drink alcohol or use drugs.  Family History: family history includes Diabetes in her paternal aunt and sister.   Physical Exam: BP (!) 142/98   Pulse (!) 105   Temp 97.6 F (36.4 C)   Wt 64.9 kg   LMP 06/18/2019 (Exact Date)   BMI 27.93 kg/m    Asessement: 1. Previous cesarean delivery affecting pregnancy, antepartum   2. Pre-existing type 2 diabetes mellitus during pregnancy in first trimester   3. Chronic hypertension affecting pregnancy   4. Supervision of high risk  pregnancy, antepartum     Plan: 1. Type 2 DM--doing much better. Continue current insulin dosage. She will call with log in 3 weeks and will bring with her in 6w at time of anatomic survey/MFM consultation  2. Recommend increase labetalol to 200mg  bid and plan to bring bp cuff with her at the time of the next office visit to compare to office cuff.  Continue low dose aspirin  Additional recommendations/surveillance as previously outlined.   Total time spent with the patient was 20 minutes with greater than 50% spent in counseling and coordination of care. We appreciate this interesting consult and will be happy to be involved in the ongoing care of [name] in anyway her obstetricians desire.  [facility]

## 2019-09-24 ENCOUNTER — Other Ambulatory Visit: Payer: Self-pay

## 2019-09-24 ENCOUNTER — Ambulatory Visit (INDEPENDENT_AMBULATORY_CARE_PROVIDER_SITE_OTHER): Payer: Medicaid Other | Admitting: Obstetrics & Gynecology

## 2019-09-24 ENCOUNTER — Encounter: Payer: Self-pay | Admitting: Obstetrics & Gynecology

## 2019-09-24 VITALS — BP 128/84 | Wt 148.0 lb

## 2019-09-24 DIAGNOSIS — O10919 Unspecified pre-existing hypertension complicating pregnancy, unspecified trimester: Secondary | ICD-10-CM

## 2019-09-24 DIAGNOSIS — Z3A14 14 weeks gestation of pregnancy: Secondary | ICD-10-CM

## 2019-09-24 DIAGNOSIS — O10912 Unspecified pre-existing hypertension complicating pregnancy, second trimester: Secondary | ICD-10-CM

## 2019-09-24 DIAGNOSIS — O24111 Pre-existing diabetes mellitus, type 2, in pregnancy, first trimester: Secondary | ICD-10-CM

## 2019-09-24 DIAGNOSIS — O0992 Supervision of high risk pregnancy, unspecified, second trimester: Secondary | ICD-10-CM

## 2019-09-24 DIAGNOSIS — O0990 Supervision of high risk pregnancy, unspecified, unspecified trimester: Secondary | ICD-10-CM

## 2019-09-24 DIAGNOSIS — O24112 Pre-existing diabetes mellitus, type 2, in pregnancy, second trimester: Secondary | ICD-10-CM

## 2019-09-24 DIAGNOSIS — O34219 Maternal care for unspecified type scar from previous cesarean delivery: Secondary | ICD-10-CM

## 2019-09-24 LAB — POCT URINALYSIS DIPSTICK OB
Glucose, UA: NEGATIVE
Urobilinogen, UA: NEGATIVE E.U./dL — AB

## 2019-09-24 MED ORDER — LABETALOL HCL 200 MG PO TABS: 200.0000 mg | ORAL_TABLET | Freq: Two times a day (BID) | ORAL | 2 refills | Status: DC

## 2019-09-24 MED ORDER — LABETALOL HCL 100 MG PO TABS: 100.0000 mg | ORAL_TABLET | Freq: Two times a day (BID) | ORAL | 3 refills | Status: DC

## 2019-09-24 NOTE — Progress Notes (Signed)
Subjective  Fetal Movement? yes No pain or nausea BS LOG- all normal    On Insulin and Metformin Denies headache, blurry vision, CP, SOB, edema, epigastric pain  Objective  BP 128/84   Wt 148 lb (67.1 kg)   LMP 06/18/2019 (Exact Date)   BMI 28.90 kg/m  General: NAD Pumonary: no increased work of breathing Abdomen: gravid, non-tender Extremities: no edema Psychiatric: mood appropriate, affect full  Assessment  33 y.o. G3P2002 at [redacted]w[redacted]d by  03/24/2020, by Last Menstrual Period presenting for routine prenatal visit  Plan   pregnancy Problems (from 08/01/19 to present)    Problem Noted Resolved   Chronic hypertension affecting pregnancy  No   Overview Addendum 09/24/2019  4:27 PM by [name], MD    [x ] Aspirin 81 mg daily after 12 weeks; discontinue after 36 weeks [x ] baseline labs with CBC, CMP, urine protein/creatinine ratio [x ] no BP meds unless BPs become elevated Labetalol started, 100 BID as of 12/1 [ ]  ultrasound for growth at 28, 32, 36 weeks [ ]  Baseline EKG   Current antihypertensives:  Labetalol 100 mgm BID (Losartan and HCTZ were discontinued   Baseline and surveillance labs (pulled in from [facility], refresh links as needed)  Lab Results  Component Value Date   PLT 388 08/01/2019   CREATININE 0.56 (L) 08/07/2019   AST 16 08/07/2019   ALT 10 08/07/2019    Antenatal Testing CHTN - O10.919  Group I  BP < 140/90, no preeclampsia, AGA,  nml AFV, +/- meds    Group II BP > 140/90, on meds, no preeclampsia, AGA, nml AFV  20-28-34-38  20-24-28-32-35-38  32//2 x wk  28//BPP wkly then 32//2 x wk  40 no meds; 39 meds  PRN or 37  Pre-eclampsia  GHTN - O13.9/Preeclampsia without severe features  - O14.00   Preeclampsia with severe features - O14.10  Q 3-4wks  Q 2 wks  28//BPP wkly then 32//2 x wk  Inpatient  37  PRN or 34        Previous cesarean delivery affecting pregnancy, antepartum 08/13/2019 by [name], CNM No   Overview  Addendum 09/24/2019  4:28 PM by [name], MD    Hx of 2 Cesarean sections Plan CS 39 weeks      Pre-existing type 2 diabetes mellitus during pregnancy in first trimester 08/13/2019 by [name], CNM No   Overview Addendum 08/20/2019 12:18 PM by [name], MD    Current Diabetic Medications:  Lantus Insulin,  Metformin    Long-acting   * Lantus 24 units    * Metformin [x ] Aspirin 81 mg daily after 12 weeks; discontinue after 36 weeks (? A2/B GDM)  Required Referrals for A1GDM or A2GDM: [ ]  Diabetes Education and Testing Supplies [ ]  Nutrition Cousult  For A2/B GDM or higher classes of DM [x ] Diabetes Education and Testing Supplies [x ] Nutrition Counsult [x ] Fetal ECHO after 22-24 weeks  [x ] Eye exam for retina evaluation  [x ] Baseline EKG [ x] US fetal growth every 4 weeks starting at 28 weeks [x ] Twice weekly NST starting at [redacted] weeks gestation [x ] Delivery planning contingent on fetal growth, AFI, glycemic control, and other co-morbidities but at least by 39 weeks  Baseline and surveillance labs (pulled in from [facility], refresh links as needed)  Lab Results  Component Value Date   CREATININE 0.56 (L) 08/07/2019   AST 16 08/07/2019   ALT  10 08/07/2019   TSH 0.841 09/26/2018   Lab Results  Component Value Date   HGBA1C 7.7 (H) 08/07/2019   HGBA1C 7.7 (H) 05/29/2019   HGBA1C 8.8 (H) 04/10/2019    Antenatal Testing Class of DM U/S NST/AFI DELIVERY  Diabetes   A1 - good control - O24.410    A2 - good control - O24.419      A2  - poor control or poor compliance - O24.419, E11.65   (Macrosomia or polyhydramnios) **E11.65 is extra code for poor control**    A2/B - O24.919  and B-C O24.319  Poor control B-C or D-R-F-T - O24.319  or  Type I DM - O24.019  20-38  20-38  20-24-28-32-36   20-24-28-32-35-38//fetal echo  20-24-27-30-33-36-38//fetal echo  40  32//2 x wk  32//2 x wk   32//2 x wk  28//BPP wkly then 32//2 x wk   40  39  PRN   39  PRN   Has Bionime glucometer 10/14 hemoglobin A1c was 7.7%      Supervision of high risk pregnancy, antepartum 08/01/2019 by [name], CNM No   Overview Addendum 09/24/2019  4:28 PM by [name], MD    Clinic [facility] Prenatal Labs  Dating [place] Blood type: O/Positive/-- (10/08 1549) O POSITIVE  Genetic Screen declines Antibody:Negative (10/08 1549)negative  Anatomic [place] DP MFM Rubella: 3.99 (10/08 1549) Immune Varicella: Immue  GTT Type 2 DM RPR: Non Reactive (10/08 1549) negative  Rhogam n/a HBsAg: Negative (10/08 1549) negative  Vaccines TDAP:                       Flu Shot: HIV: Non Reactive (10/08 1549) non reactive  Baby Food Formula                              GBS:   Contraception  Pap: at [facility] June 2020, neg per pt report  CBB  No Hemoglobin A1C 7.7% on 10/14  CS/VBAC 2006 primary for FTP 8#, 2009 repeat 9#10oz   Support Person Partner [name]- 6'9" tall           Anat [place] at [facility] on 10/31/2019 Discussed diabetes management and HTN management Cont Labetalol 100 mg BID, as she has been taking this and now entering second trimester where BP may have natural drop Monitor for s/sx worsening HTN Plans CS 39 weeks (or before)  [name], MD, [name], [facility] 09/24/2019  4:40 PM

## 2019-09-24 NOTE — Patient Instructions (Signed)

## 2019-09-30 ENCOUNTER — Ambulatory Visit: Payer: Medicaid Other

## 2019-10-08 ENCOUNTER — Ambulatory Visit: Payer: Self-pay

## 2019-10-23 ENCOUNTER — Encounter: Payer: Self-pay | Admitting: Advanced Practice Midwife

## 2019-10-23 ENCOUNTER — Other Ambulatory Visit: Payer: Self-pay

## 2019-10-23 ENCOUNTER — Ambulatory Visit (INDEPENDENT_AMBULATORY_CARE_PROVIDER_SITE_OTHER): Payer: Medicaid Other | Admitting: Advanced Practice Midwife

## 2019-10-23 ENCOUNTER — Telehealth: Payer: Self-pay

## 2019-10-23 VITALS — BP 130/80 | Wt 151.0 lb

## 2019-10-23 DIAGNOSIS — O24119 Pre-existing diabetes mellitus, type 2, in pregnancy, unspecified trimester: Secondary | ICD-10-CM

## 2019-10-23 DIAGNOSIS — Z3A18 18 weeks gestation of pregnancy: Secondary | ICD-10-CM

## 2019-10-23 DIAGNOSIS — O24112 Pre-existing diabetes mellitus, type 2, in pregnancy, second trimester: Secondary | ICD-10-CM

## 2019-10-23 MED ORDER — CVS GLUCOSE METER TEST STRIPS VI STRP: ORAL_STRIP | 12 refills | Status: DC

## 2019-10-23 MED ORDER — CVS LANCETS 21G MISC: 12 refills | Status: DC

## 2019-10-23 NOTE — Progress Notes (Signed)
Routine Prenatal Care Visit  Subjective  Deanna Zimmerman is a 33 y.o. G3P2002 at 3964w1d being seen today for ongoing prenatal care.  She is currently monitored for the following issues for this high-risk pregnancy and has Diabetes mellitus, new onset (HCC); Hypertension; Goiter; Type 2 diabetes mellitus with hyperglycemia, without long-term current use of insulin (HCC); Supervision of high risk pregnancy, antepartum; Chronic hypertension affecting pregnancy; Previous cesarean delivery affecting pregnancy, antepartum; and Pre-existing type 2 diabetes mellitus during pregnancy, antepartum on their problem list.  ----------------------------------------------------------------------------------- Patient reports low back pain and nasal congestion. We discussed comfort measures.    . Vag. Bleeding: None.  Movement: Present. Leaking Fluid denies.  ----------------------------------------------------------------------------------- The following portions of the patient's history were reviewed and updated as appropriate: allergies, current medications, past family history, past medical history, past social history, past surgical history and problem list. Problem list updated.  Objective  Blood pressure 130/80, weight 151 lb (68.5 kg), last menstrual period 06/18/2019. Pregravid weight 145 lb (65.8 kg) Total Weight Gain 6 lb (2.722 kg) Urinalysis: Urine Protein    Urine Glucose     Blood sugar log reviewed with normal results with the exception of 1 fasting of 98 and 2 after breakfast in upper 120s  Fetal Status: Fetal Heart Rate (bpm): 147 Fundal Height: 19 cm Movement: Present     General:  Alert, oriented and cooperative. Patient is in no acute distress.  Skin: Skin is warm and dry. No rash noted.   Cardiovascular: Normal heart rate noted  Respiratory: Normal respiratory effort, no problems with respiration noted  Abdomen: Soft, gravid, appropriate for gestational age. Pain/Pressure: Absent       Pelvic:  Cervical exam deferred        Extremities: Normal range of motion.     Mental Status: Normal mood and affect. Normal behavior. Normal judgment and thought content.   Assessment   33 y.o. G3P2002 at 1264w1d by  03/24/2020, by Last Menstrual Period presenting for routine prenatal visit  Plan   pregnancy Problems (from 08/01/19 to present)    Problem Noted Resolved   Chronic hypertension affecting pregnancy 08/13/2019 by Farrel ConnersGutierrez, Colleen, CNM No   Overview Addendum 09/24/2019  4:44 PM by Nadara MustardHarris, Robert P, MD    [x ] Aspirin 81 mg daily after 12 weeks; discontinue after 36 weeks [x ] baseline labs with CBC, CMP, urine protein/creatinine ratio [ ]  no BP meds unless BPs become elevated Labetalol started, 100 BID as of 12/1 [ ]  ultrasound for growth at 28, 32, 36 weeks [ ]  Baseline EKG   Current antihypertensives:  Labetalol 100 mgm BID (Losartan and HCTZ were discontinued   Baseline and surveillance labs [facility], refresh links as needed)  Lab Results  Component Value Date   PLT 388 08/01/2019   CREATININE 0.56 (L) 08/07/2019   AST 16 08/07/2019   ALT 10 08/07/2019    Antenatal Testing CHTN - O10.919  Group I  BP < 140/90, no preeclampsia, AGA,  nml AFV, +/- meds    Group II BP > 140/90, on meds, no preeclampsia, AGA, nml AFV  20-28-34-38  20-24-28-32-35-38  32//2 x wk  28//BPP wkly then 32//2 x wk  40 no meds; 39 meds  PRN or 37  Pre-eclampsia  GHTN - O13.9/Preeclampsia without severe features  - O14.00   Preeclampsia with severe features - O14.10  Q 3-4wks  Q 2 wks  28//BPP wkly then 32//2 x wk  Inpatient  37  PRN or 34  Previous cesarean delivery affecting pregnancy, antepartum 08/13/2019 by Farrel Conners, CNM No   Overview Addendum 09/24/2019  4:28 PM by [name], MD    Hx of 2 Cesarean sections Plan CS 39 weeks      Pre-existing type 2 diabetes mellitus during pregnancy, antepartum 08/13/2019 by Farrel Conners, CNM No   Overview Addendum 08/20/2019 12:18 PM by Conard Novak, MD    Current Diabetic Medications:  Lantus Insulin,  Metformin    Long-acting   * Lantus 24 units    * Metformin [x ] Aspirin 81 mg daily after 12 weeks; discontinue after 36 weeks (? A2/B GDM)  Required Referrals for A1GDM or A2GDM: [ ]  Diabetes Education and Testing Supplies [ ]  Nutrition Cousult  For A2/B GDM or higher classes of DM [x ] Diabetes Education and Testing Supplies [x ] Nutrition Counsult [x ] Fetal ECHO after 22-24 weeks  [x ] Eye exam for retina evaluation  [x ] Baseline EKG [ x] fetal growth every 4 weeks starting at 28 weeks [x ] Twice weekly NST starting at [redacted] weeks gestation [x ] Delivery planning contingent on fetal growth, AFI, glycemic control, and other co-morbidities but at least by 39 weeks  Baseline and surveillance labs (pulled in from [facility], refresh links as needed)  Lab Results  Component Value Date   CREATININE 0.56 (L) 08/07/2019   AST 16 08/07/2019   ALT 10 08/07/2019   TSH 0.841 09/26/2018   Lab Results  Component Value Date   HGBA1C 7.7 (H) 08/07/2019   HGBA1C 7.7 (H) 05/29/2019   HGBA1C 8.8 (H) 04/10/2019    Antenatal Testing Class of DM U/S NST/AFI DELIVERY  Diabetes   A1 - good control - O24.410    A2 - good control - O24.419      A2  - poor control or poor compliance - O24.419, E11.65   (Macrosomia or polyhydramnios) **E11.65 is extra code for poor control**    A2/B - O24.919  and B-C O24.319  Poor control B-C or D-R-F-T - O24.319  or  Type I DM - O24.019  20-38  20-38  20-24-28-32-36   20-24-28-32-35-38//fetal echo  20-24-27-30-33-36-38//fetal echo  40  32//2 x wk  32//2 x wk   32//2 x wk  28//BPP wkly then 32//2 x wk  40  39  PRN   39  PRN   Has Bionime glucometer 10/14 hemoglobin A1c was 7.7%      Supervision of high risk pregnancy, antepartum 08/01/2019 by 11/14, CNM No   Overview Addendum 09/24/2019   4:28 PM by [name], MD    Clinic [facility] Prenatal Labs  Dating 14/10/2018 Blood type: O/Positive/-- (10/08 1549) O POSITIVE  Genetic Screen declines Antibody:Negative (10/08 1549)negative  Anatomic [birth year]1986 DP MFM Rubella: 3.99 (10/08 1549) Immune Varicella: Immue  GTT Type 2 DM RPR: Non Reactive (10/08 1549) negative  Rhogam n/a HBsAg: Negative (10/08 1549) negative  Vaccines TDAP:                       Flu Shot: HIV: Non Reactive (10/08 1549) non reactive  Baby Food Formula                              GBS:   Contraception  Pap: at [facility] June 2020, neg per pt report  CBB  No Hemoglobin A1C 7.7% on 10/14  CS/VBAC 2006 primary for FTP  8#, 2009 repeat 9#10oz   Support Person Partner Sonia Side- 6'9" tall           Test strips and lancets reordered  [facility] in 1 [facility]  Preterm labor symptoms and general obstetric precautions including but not limited to vaginal bleeding, contractions, leaking of fluid and fetal movement were reviewed in detail with the patient.    Return in about 4 weeks (around 11/20/2019) for rob.  Rod Can, CNM 10/23/2019 3:21 PM

## 2019-10-23 NOTE — Telephone Encounter (Signed)
Medicaid does not cover her glucose meter or strips. Pharmacy said medicaid cover I Check. If we could please change rx

## 2019-10-24 NOTE — Telephone Encounter (Signed)
Hi Beverly, I am having a hard time ordering test strips for this patient. Glennon Mac says when he has this problem that you magically make it better! Actually he said that you send a request to the pharmacy on paper!! Are you able to do this for me with the information included here- Monitor is [employer] #[id number]. I would be so grateful:) Thanks, Opal Sidles

## 2019-10-24 NOTE — Telephone Encounter (Signed)
Pt calling to let JEG know her monitor is [employer] #[id number].  [phone]

## 2019-10-28 ENCOUNTER — Other Ambulatory Visit: Payer: Self-pay

## 2019-10-29 NOTE — Telephone Encounter (Signed)
Pt got different machine and picked up supplies yesterday from pharmacy. Medicaid covered

## 2019-10-31 ENCOUNTER — Ambulatory Visit (HOSPITAL_BASED_OUTPATIENT_CLINIC_OR_DEPARTMENT_OTHER)
Admission: RE | Admit: 2019-10-31 | Discharge: 2019-10-31 | Disposition: A | Discharge status: Home / Self Care | Payer: Medicaid Other | Source: Ambulatory Visit | Attending: Maternal & Fetal Medicine | Admitting: Maternal & Fetal Medicine

## 2019-10-31 ENCOUNTER — Other Ambulatory Visit: Payer: Self-pay | Admitting: Maternal & Fetal Medicine

## 2019-10-31 ENCOUNTER — Other Ambulatory Visit: Payer: Self-pay

## 2019-10-31 ENCOUNTER — Ambulatory Visit
Admission: RE | Admit: 2019-10-31 | Discharge: 2019-10-31 | Disposition: A | Discharge status: Home / Self Care | Payer: Medicaid Other | Source: Ambulatory Visit | Attending: Maternal & Fetal Medicine | Admitting: Maternal & Fetal Medicine

## 2019-10-31 DIAGNOSIS — O34219 Maternal care for unspecified type scar from previous cesarean delivery: Secondary | ICD-10-CM

## 2019-10-31 DIAGNOSIS — I10 Essential (primary) hypertension: Secondary | ICD-10-CM | POA: Diagnosis not present

## 2019-10-31 DIAGNOSIS — Z3A19 19 weeks gestation of pregnancy: Secondary | ICD-10-CM | POA: Diagnosis not present

## 2019-10-31 DIAGNOSIS — Z794 Long term (current) use of insulin: Secondary | ICD-10-CM | POA: Insufficient documentation

## 2019-10-31 DIAGNOSIS — O24119 Pre-existing diabetes mellitus, type 2, in pregnancy, unspecified trimester: Secondary | ICD-10-CM | POA: Diagnosis not present

## 2019-10-31 DIAGNOSIS — O162 Unspecified maternal hypertension, second trimester: Secondary | ICD-10-CM | POA: Diagnosis not present

## 2019-10-31 DIAGNOSIS — E1165 Type 2 diabetes mellitus with hyperglycemia: Secondary | ICD-10-CM

## 2019-10-31 DIAGNOSIS — O0990 Supervision of high risk pregnancy, unspecified, unspecified trimester: Secondary | ICD-10-CM

## 2019-10-31 DIAGNOSIS — O10919 Unspecified pre-existing hypertension complicating pregnancy, unspecified trimester: Secondary | ICD-10-CM

## 2019-10-31 DIAGNOSIS — Z79899 Other long term (current) drug therapy: Secondary | ICD-10-CM | POA: Insufficient documentation

## 2019-10-31 DIAGNOSIS — O24112 Pre-existing diabetes mellitus, type 2, in pregnancy, second trimester: Secondary | ICD-10-CM | POA: Diagnosis present

## 2019-10-31 LAB — GLUCOSE, CAPILLARY: Glucose-Capillary: 128 mg/dL — ABNORMAL HIGH (ref 70–99)

## 2019-10-31 NOTE — Progress Notes (Addendum)
[facility] Follow up Consultation    HPI: [name] is a 34 y.o. G3P2002 at  [redacted]w[redacted]d gestation who presents for follow-up  consultation for glycemic review due to T2 DM. She is currently taking Lantus 24u at HS, 4 u of Humalog with meals and metformin 1000mg  bid.  Her pregnancy is also complicated by hypertension.  She is on labetalol 100mg  bid for cHTN.  She reports BPs ~ 120/80 at home  Blood glucose log Fastings 69-123 with 3/14 > 95 2 h post breakfast:81-149 with 5/13 > 120 2h post lunch: 72-119 with 0/13 > 120 2h post dinner: 73-130  With 2/13 > 120   Medication Sig  . Insulin Glargine (LANTUS SOLOSTAR) 100 UNIT/ML Solostar Pen Inject 24 Units into the skin at bedtime.  . insulin lispro (HUMALOG) 100 UNIT/ML injection 4 units with meals  . labetalol (NORMODYNE) 100 MG tablet Take 1 tablet (100 mg total) by mouth 2 (two) times daily.  . metFORMIN (GLUCOPHAGE) 1000 MG tablet Take 1 tablet (1,000 mg total) by mouth 2 (two) times daily with a meal.  . Prenatal Vit-Fe Fumarate-FA (PRENATAL VITAMIN PO) Take by mouth.   Physical Exam:  T 97.3 , HR 100, R 18, O2sat 100% BP 138/96.  132/92, 131/90  on recheck.   Her radial cuff reads 118/79  RBS today is 128   Asessement: 1. Previous cesarean delivery affecting pregnancy, antepartum   2. Pre-existing type 2 diabetes mellitus during pregnancy in first trimester   3. Chronic hypertension affecting pregnancy    07-27-2003 today - Normal anatomy with subop spine. No previa. Cx 4.2 cm.  Plan: Type 2 DM with relatively well controlled BSs: Increase breakfast Humalog to 6 units.  Other meals the same.   Continue current insulin dosage. We will review her BSs when we see her in 2 weeks for completion of her anatomy 3/13.  Fetal echo in 3 weeks - [facility]  Monthly Zimmerman growth surveillance beginning at 28 weeks followed by weekly antenatal testing beginning at 32 weeks and twice weekly testing  beginning at 34 weeks with delivery at 37-39 weeks depnding on maternal and fetal status.  CHTN - see our previous recommendations We prescribed a standard (arm) BP cuff today.  We will reassess her BPs when she returns in 2 weeks.   Fetal surveillance as outlined above.   Total time spent with the patient was 30 minutes with greater than 50% spent in counseling and coordination of care.  We appreciate  this consult and will be happy to be involved in the ongoing care of Deanna Zimmerman in anyway her obstetricians desire.  [place], MD [facility]

## 2019-10-31 NOTE — Progress Notes (Signed)
Pt VS above when arrival to [facility]. Pt was nervous on arrival so it was rechecked and was 131/90.  Pt was encouraged to bring in her personal BP cuff to this appt for comparison.  Pt's is a radial BP cuff and was 118/79 as compared to the BP above.  Dr. Fayrene Fearing notified of difference.

## 2019-11-07 ENCOUNTER — Other Ambulatory Visit: Payer: Self-pay

## 2019-11-07 DIAGNOSIS — Z3A21 21 weeks gestation of pregnancy: Secondary | ICD-10-CM

## 2019-11-14 ENCOUNTER — Ambulatory Visit (HOSPITAL_BASED_OUTPATIENT_CLINIC_OR_DEPARTMENT_OTHER)
Admission: RE | Admit: 2019-11-14 | Discharge: 2019-11-14 | Disposition: A | Discharge status: Home / Self Care | Payer: Medicaid Other | Source: Ambulatory Visit | Attending: Obstetrics and Gynecology | Admitting: Obstetrics and Gynecology

## 2019-11-14 ENCOUNTER — Ambulatory Visit
Admission: RE | Admit: 2019-11-14 | Discharge: 2019-11-14 | Disposition: A | Discharge status: Home / Self Care | Payer: Medicaid Other | Source: Ambulatory Visit | Attending: Obstetrics and Gynecology | Admitting: Obstetrics and Gynecology

## 2019-11-14 ENCOUNTER — Other Ambulatory Visit: Payer: Self-pay

## 2019-11-14 DIAGNOSIS — O34219 Maternal care for unspecified type scar from previous cesarean delivery: Secondary | ICD-10-CM | POA: Diagnosis not present

## 2019-11-14 DIAGNOSIS — O10112 Pre-existing hypertensive heart disease complicating pregnancy, second trimester: Secondary | ICD-10-CM | POA: Diagnosis not present

## 2019-11-14 DIAGNOSIS — Z3A21 21 weeks gestation of pregnancy: Secondary | ICD-10-CM | POA: Diagnosis not present

## 2019-11-14 DIAGNOSIS — Z794 Long term (current) use of insulin: Secondary | ICD-10-CM | POA: Insufficient documentation

## 2019-11-14 DIAGNOSIS — Z79899 Other long term (current) drug therapy: Secondary | ICD-10-CM | POA: Diagnosis not present

## 2019-11-14 DIAGNOSIS — O24112 Pre-existing diabetes mellitus, type 2, in pregnancy, second trimester: Secondary | ICD-10-CM | POA: Insufficient documentation

## 2019-11-14 DIAGNOSIS — E119 Type 2 diabetes mellitus without complications: Secondary | ICD-10-CM | POA: Diagnosis not present

## 2019-11-14 LAB — GLUCOSE, CAPILLARY: Glucose-Capillary: 160 mg/dL — ABNORMAL HIGH (ref 70–99)

## 2019-11-14 NOTE — Progress Notes (Signed)
[facility]    HPI:[name] a 34 y.o.G3P2002 at 106w2dgestation who presents for follow-up  consultation for glycemic review due to T2 DM. She is currently taking Lantus 24u at HS, 6 u of Humalog with breakfast and 4 U of Humalog with lunch and dinner as well as metformin 1000mg  bid. Her pregnancy is also complicated by hypertension.  She is on labetalol 100mg  bid for cHTN. She reports BPs ~ 110s to 130's/70-80's at home  Blood glucose log Fastings 63-102 with 4/14 > 95 2 h post breakfast:  98-177 with 8/14 > 120 2h post lunch: 68-139 with 2/12 > 120 2h post dinner: 77-150  With 6/13 > 120       Medication Sig  . Insulin Glargine (LANTUS SOLOSTAR) 100 UNIT/ML Solostar Pen Inject 24 Units into the skin at bedtime.  . insulin lispro (HUMALOG) 100 UNIT/ML injection 4 units with meals  . labetalol (NORMODYNE) 100 MG tablet Take 1 tablet (100 mg total) by mouth 2 (two) times daily.  . metFORMIN (GLUCOPHAGE) 1000 MG tablet Take 1 tablet (1,000 mg total) by mouth 2 (two) times daily with a meal.  . Prenatal Vit-Fe Fumarate-FA (PRENATAL VITAMIN PO) Take by mouth.   Physical Exam:   129/80 107 97.5 70.08 kg  RBS today is 160 1 hour after her most recent meal   Asessement: 1. Previous cesarean delivery affecting pregnancy, antepartum   2. Pre-existing type 2 diabetes mellitus during pregnancy in first trimester   3. Chronic hypertension affecting pregnancy    4/12 today to complete spine views (seen and appear normal).  Plan: Type 2 DM :   - Increase Humalog to 6 units with each meal and Lantus to 26U qhs.    - will text Deanna Zimmerman with BS q 2 weeks  - f/up consult/review of BS and BPs with next growth ultrasound at 28 weeks unless indicated earlier  - Fetal echo today- [facility] - reported to be normal   -Monthly 7/13 growth surveillance beginning at 28 weeksfollowed by weekly antenatal testing  beginning at 32 weeks and twice weekly testing beginning at 34 weeks with delivery at 37-39 weeks depending on maternal and fetal status.  CHTN   - see our previous recommendations  -She was previously prescribed a standard (arm) BP cuff and will continue taking her BPs.  She was given guidelines for when to call with abnormal BP or symptoms of preeclampsia.  We will reassess her BPs when she returns.    -Fetal surveillance as outlined above.  Total time spent with the patient was46minutes with greater than 50% spent in counseling and coordination of care.  We appreciate  this consult and will be happy to be involved in the ongoing care of Ms.Deanna Zimmerman anyway her obstetricians desire.    [place], MD

## 2019-11-20 ENCOUNTER — Other Ambulatory Visit: Payer: Self-pay

## 2019-11-20 ENCOUNTER — Encounter: Payer: Self-pay | Admitting: Obstetrics & Gynecology

## 2019-11-20 ENCOUNTER — Ambulatory Visit (INDEPENDENT_AMBULATORY_CARE_PROVIDER_SITE_OTHER): Payer: Medicaid Other | Admitting: Obstetrics & Gynecology

## 2019-11-20 VITALS — BP 120/80 | Wt 155.0 lb

## 2019-11-20 DIAGNOSIS — Z3A22 22 weeks gestation of pregnancy: Secondary | ICD-10-CM

## 2019-11-20 DIAGNOSIS — O34219 Maternal care for unspecified type scar from previous cesarean delivery: Secondary | ICD-10-CM

## 2019-11-20 DIAGNOSIS — O0990 Supervision of high risk pregnancy, unspecified, unspecified trimester: Secondary | ICD-10-CM

## 2019-11-20 DIAGNOSIS — O24119 Pre-existing diabetes mellitus, type 2, in pregnancy, unspecified trimester: Secondary | ICD-10-CM

## 2019-11-20 DIAGNOSIS — O10919 Unspecified pre-existing hypertension complicating pregnancy, unspecified trimester: Secondary | ICD-10-CM

## 2019-11-20 DIAGNOSIS — O24112 Pre-existing diabetes mellitus, type 2, in pregnancy, second trimester: Secondary | ICD-10-CM

## 2019-11-20 DIAGNOSIS — O0992 Supervision of high risk pregnancy, unspecified, second trimester: Secondary | ICD-10-CM

## 2019-11-20 DIAGNOSIS — O10912 Unspecified pre-existing hypertension complicating pregnancy, second trimester: Secondary | ICD-10-CM

## 2019-11-20 NOTE — Progress Notes (Signed)
Subjective  Fetal Movement? yes Contractions? no Leaking Fluid? no Vaginal Bleeding? no BS mostly normal    Recent change in Insulin per DP No s/sx preeclampsia w sx check  Objective  BP 120/80   Wt 155 lb (70.3 kg)   LMP 06/18/2019 (Exact Date)   BMI 30.27 kg/m  General: NAD Pumonary: no increased work of breathing Abdomen: gravid, non-tender Extremities: no edema Psychiatric: mood appropriate, affect full  Assessment  34 y.o. G3P2002 at [redacted]w[redacted]d by  03/24/2020, by Last Menstrual Period presenting for routine prenatal visit  Plan   Problem List Items Addressed This Visit      Cardiovascular and Mediastinum   Chronic hypertension affecting pregnancy     Endocrine   Pre-existing type 2 diabetes mellitus during pregnancy, antepartum     Other   Supervision of high risk pregnancy, antepartum   Previous cesarean delivery affecting pregnancy, antepartum    Other Visit Diagnoses    [redacted] weeks gestation of pregnancy    -  Primary      pregnancy Problems (from 08/01/19 to present)    Problem Noted Resolved   Chronic hypertension affecting pregnancy 08/13/2019 by [name], CNM No   Overview Addendum 11/20/2019 10:35 AM by [name], MD    [x ] Aspirin 81 mg daily after 12 weeks; discontinue after 36 weeks [x ] baseline labs with CBC, CMP, urine protein/creatinine ratio [ ]  no BP meds unless BPs become elevated Labetalol started, 100 BID as of 12/1 [ ]  ultrasound for growth at 28, 32, 36 weeks [ ]  Baseline EKG   Current antihypertensives:  Labetalol 200 mgm BID (Losartan and HCTZ were discontinued)  Baseline and surveillance labs (pulled in from [facility], refresh links as needed)  Lab Results  Component Value Date   PLT 388 08/01/2019   CREATININE 0.56 (L) 08/07/2019   AST 16 08/07/2019   ALT 10 08/07/2019    Antenatal Testing CHTN - O10.919  Group I  BP < 140/90, no preeclampsia, AGA,  nml AFV, +/- meds    Group II BP > 140/90, on meds, no  preeclampsia, AGA, nml AFV  20-28-34-38  20-24-28-32-35-38  32//2 x wk  28//BPP wkly then 32//2 x wk  40 no meds; 39 meds  PRN or 37  Pre-eclampsia  GHTN - O13.9/Preeclampsia without severe features  - O14.00   Preeclampsia with severe features - O14.10  Q 3-4wks  Q 2 wks  28//BPP wkly then 32//2 x wk  Inpatient  37  PRN or 34   UPDATED PLAN: (1/27) MonthlyUSgrowth surveillance beginning at 28 weeksfollowed by weekly antenatal testing beginning at 32 weeks and twice weekly testing beginning at 13 weekswith delivery at 37-39 weeks depending on maternal and fetal status.      Previous cesarean delivery affecting pregnancy, antepartum 08/13/2019 by [name], CNM No   Overview Addendum 11/20/2019 10:34 AM by [name], MD    Hx of 2 Cesarean sections Plan CS 39 weeks Desires BTL as well      Pre-existing type 2 diabetes mellitus during pregnancy, antepartum 08/13/2019 by [name], CNM No   Overview Addendum 11/16/2019 10:23 AM by [name], CNM    Current Diabetic Medications:  Lantus Insulin,  Metformin    Long-acting   * Lantus 24 units    * Metformin [x ] Aspirin 81 mg daily after 12 weeks; discontinue after 36 weeks (? A2/B GDM)  Required Referrals for A1GDM or A2GDM: [ ]  Diabetes Education and Testing Supplies [ ]   Nutrition Cousult  For A2/B GDM or higher classes of DM [x ] Diabetes Education and Testing Supplies [x ] Nutrition Counsult [x ] Fetal ECHO after 22-24 weeks (fetal echo normal on 1/21)  [x ] Eye exam for retina evaluation  [x ] Baseline EKG [ x] US fetal growth every 4 weeks starting at 28 weeks [x ] Twice weekly NST starting at [redacted] weeks gestation [x ] Delivery planning contingent on fetal growth, AFI, glycemic control, and other co-morbidities but at least by 39 weeks  Baseline and surveillance labs (pulled in from [facility], refresh links as needed)  Lab Results  Component Value Date   CREATININE 0.56 (L)  08/07/2019   AST 16 08/07/2019   ALT 10 08/07/2019   TSH 0.841 09/26/2018   Lab Results  Component Value Date   HGBA1C 7.7 (H) 08/07/2019   HGBA1C 7.7 (H) 05/29/2019   HGBA1C 8.8 (H) 04/10/2019    Antenatal Testing Class of DM U/S NST/AFI DELIVERY  Diabetes   A1 - good control - O24.410    A2 - good control - O24.419      A2  - poor control or poor compliance - O24.419, E11.65   (Macrosomia or polyhydramnios) **E11.65 is extra code for poor control**    A2/B - O24.919  and B-C O24.319  Poor control B-C or D-R-F-T - O24.319  or  Type I DM - O24.019  20-38  20-38  20-24-28-32-36   20-24-28-32-35-38//fetal echo  20-24-27-30-33-36-38//fetal echo  40  32//2 x wk  32//2 x wk   32//2 x wk  28//BPP wkly then 32//2 x wk  40  39  PRN   39  PRN   Has Bionime glucometer 10/14 hemoglobin A1c was 7.7%      Supervision of high risk pregnancy, antepartum 08/01/2019 by [name], CNM No   Overview Addendum 11/20/2019 10:34 AM by [name], MD    Clinic [facility]  Dating [place] Blood type: O/Positive/-- (10/08 1549) O POSITIVE  Genetic Screen declines Antibody:Negative (10/08 1549)negative  Anatomic [place] DP MFM Rubella: 3.99 (10/08 1549) Immune Varicella: Immue  GTT Type 2 DM RPR: Non Reactive (10/08 1549) negative  Rhogam n/a HBsAg: Negative (10/08 1549) negative  Vaccines TDAP:                       Flu Shot: HIV: Non Reactive (10/08 1549) non reactive  Baby Food Formula                              GBS:   Contraception BTL, consent 30 wks [ ]  Pap: at [facility] June 2020, neg per pt report  CBB  No Hemoglobin A1C 7.7% on 10/14  CS/VBAC 2006 primary for FTP 8#, 2009 repeat 9#10oz   Support Person Partner [place]- 6'9" tall              [place], MD, [name], [facility] 11/20/2019  10:35 AM

## 2019-11-28 ENCOUNTER — Other Ambulatory Visit: Payer: Self-pay

## 2019-12-05 ENCOUNTER — Ambulatory Visit: Payer: Medicaid Other

## 2019-12-06 ENCOUNTER — Encounter: Payer: Self-pay | Admitting: Obstetrics & Gynecology

## 2019-12-06 ENCOUNTER — Other Ambulatory Visit: Payer: Self-pay

## 2019-12-06 ENCOUNTER — Ambulatory Visit (INDEPENDENT_AMBULATORY_CARE_PROVIDER_SITE_OTHER): Payer: Medicaid Other | Admitting: Obstetrics & Gynecology

## 2019-12-06 VITALS — BP 120/80 | Wt 158.0 lb

## 2019-12-06 DIAGNOSIS — O10912 Unspecified pre-existing hypertension complicating pregnancy, second trimester: Secondary | ICD-10-CM

## 2019-12-06 DIAGNOSIS — O24119 Pre-existing diabetes mellitus, type 2, in pregnancy, unspecified trimester: Secondary | ICD-10-CM

## 2019-12-06 DIAGNOSIS — Z3A24 24 weeks gestation of pregnancy: Secondary | ICD-10-CM

## 2019-12-06 DIAGNOSIS — O0990 Supervision of high risk pregnancy, unspecified, unspecified trimester: Secondary | ICD-10-CM

## 2019-12-06 DIAGNOSIS — O34219 Maternal care for unspecified type scar from previous cesarean delivery: Secondary | ICD-10-CM

## 2019-12-06 DIAGNOSIS — O10919 Unspecified pre-existing hypertension complicating pregnancy, unspecified trimester: Secondary | ICD-10-CM

## 2019-12-06 DIAGNOSIS — O09892 Supervision of other high risk pregnancies, second trimester: Secondary | ICD-10-CM

## 2019-12-06 DIAGNOSIS — O24112 Pre-existing diabetes mellitus, type 2, in pregnancy, second trimester: Secondary | ICD-10-CM

## 2019-12-06 LAB — POCT URINALYSIS DIPSTICK OB
Glucose, UA: NEGATIVE
POC,PROTEIN,UA: NEGATIVE

## 2019-12-06 NOTE — Addendum Note (Signed)
Addended by: Cornelius Moras D on: 12/06/2019 03:16 PM   Modules accepted: Orders

## 2019-12-06 NOTE — Progress Notes (Signed)
Subjective  Fetal Movement? yes Contractions? no Leaking Fluid? no Vaginal Bleeding? no BS mostly normal on Lantus 26Uqhs and Humulog 6U TID    No dizziness, nausea, other side effects No s/sx preeclampsia    Labetalol BID  Objective  BP 120/80   Wt 158 lb (71.7 kg)   LMP 06/18/2019 (Exact Date)   BMI 30.86 kg/m  General: NAD Pumonary: no increased work of breathing Abdomen: gravid, non-tender Extremities: no edema Psychiatric: mood appropriate, affect full  Assessment  34 y.o. [id number] at [redacted]w[redacted]d by  03/24/2020, by Last Menstrual Period presenting for routine prenatal visit  Plan   Problem List Items Addressed This Visit      Cardiovascular and Mediastinum   Chronic hypertension affecting pregnancy     Endocrine   Pre-existing type 2 diabetes mellitus during pregnancy, antepartum     Other   Supervision of high risk pregnancy, antepartum   Previous cesarean delivery affecting pregnancy, antepartum    Other Visit Diagnoses    [redacted] weeks gestation of pregnancy    -  Primary      pregnancy Problems (from 08/01/19 to present)    Problem Noted Resolved   Chronic hypertension affecting pregnancy 08/13/2019 by [name], CNM No   Overview Addendum 11/20/2019 10:35 AM by [name], MD    [x ] Aspirin 81 mg daily after 12 weeks; discontinue after 36 weeks [x ] baseline labs with CBC, CMP, urine protein/creatinine ratio [x ] no BP meds unless BPs become elevated- Labetalol started, 100 BID as of 12/1 [ ]  ultrasound for growth at 28, 32, 36 weeks [ ]  Baseline EKG   Current antihypertensives:  Labetalol 200 mgm BID (Losartan and HCTZ were discontinued)  Baseline and surveillance labs (pulled in from [facility], refresh links as needed)  Lab Results  Component Value Date   PLT 388 08/01/2019   CREATININE 0.56 (L) 08/07/2019   AST 16 08/07/2019   ALT 10 08/07/2019    Antenatal Testing CHTN - O10.919  Group I  BP < 140/90, no preeclampsia, AGA,  nml AFV, +/-  meds    Group II BP > 140/90, on meds, no preeclampsia, AGA, nml AFV  20-28-34-38  20-24-28-32-35-38  32//2 x wk  28//BPP wkly then 32//2 x wk  40 no meds; 39 meds  PRN or 37  Pre-eclampsia  GHTN - O13.9/Preeclampsia without severe features  - O14.00   Preeclampsia with severe features - O14.10  Q 3-4wks  Q 2 wks  28//BPP wkly then 32//2 x wk  Inpatient  37  PRN or 34   UPDATED PLAN: (1/27) MonthlyUSgrowth surveillance beginning at 28 weeksfollowed by weekly antenatal testing beginning at 32 weeks and twice weekly testing beginning at 73 weekswith delivery at 37-39 weeks depending on maternal and fetal status.      Previous cesarean delivery affecting pregnancy, antepartum 08/13/2019 by [name], CNM No   Overview Addendum 11/20/2019 10:34 AM by [name], MD    Hx of 2 Cesarean sections Plan CS 39 weeks Desires BTL as well      Pre-existing type 2 diabetes mellitus during pregnancy, antepartum 08/13/2019 by [name], CNM No   Overview Addendum 11/16/2019 10:23 AM by [name], CNM    Current Diabetic Medications:  Lantus Insulin,  Metformin    Long-acting   * Lantus 26 units                         * Humalog  6 U TID    * Metformin [x ] Aspirin 81 mg daily after 12 weeks; discontinue after 36 weeks (? A2/B GDM)  Required Referrals for A1GDM or A2GDM: [ ]  Diabetes Education and Testing Supplies [ ]  Nutrition Cousult  For A2/B GDM or higher classes of DM [x ] Diabetes Education and Testing Supplies [x ] Nutrition Counsult [x ] Fetal ECHO after 22-24 weeks (fetal echo normal on 1/21)  [x ] Eye exam for retina evaluation  [x ] Baseline EKG [ x] fetal growth every 4 weeks starting at 28 weeks [x ] Twice weekly NST starting at [redacted] weeks gestation [x ] Delivery planning contingent on fetal growth, AFI, glycemic control, and other co-morbidities but at least by 39 weeks  Baseline and surveillance labs (pulled in from [facility],  refresh links as needed)  Lab Results  Component Value Date   CREATININE 0.56 (L) 08/07/2019   AST 16 08/07/2019   ALT 10 08/07/2019   TSH 0.841 09/26/2018   Lab Results  Component Value Date   HGBA1C 7.7 (H) 08/07/2019   HGBA1C 7.7 (H) 05/29/2019   HGBA1C 8.8 (H) 04/10/2019    Antenatal Testing Class of DM U/S NST/AFI DELIVERY  Diabetes   A1 - good control - O24.410    A2 - good control - O24.419      A2  - poor control or poor compliance - O24.419, E11.65   (Macrosomia or polyhydramnios) **E11.65 is extra code for poor control**    A2/B - O24.919  and B-C O24.319  Poor control B-C or D-R-F-T - O24.319  or  Type I DM - O24.019  20-38  20-38  20-24-28-32-36   20-24-28-32-35-38//fetal echo  20-24-27-30-33-36-38//fetal echo  40  32//2 x wk  32//2 x wk   32//2 x wk  28//BPP wkly then 32//2 x wk  40  39  PRN   39  PRN   Has Bionime glucometer 10/14 hemoglobin A1c was 7.7%      Supervision of high risk pregnancy, antepartum 08/01/2019 by 11/14, CNM No   Overview Addendum 11/20/2019 10:34 AM by [name], MD    [facility]inic [facility]  Dating 11/22/2019 Blood type: O/Positive/-- (10/08 1549) O POSITIVE  Genetic Screen declines Antibody:Negative (10/08 1549)negative  Anatomic 12[birth year]25[birth year]1986 DP MFM Rubella: 3.99 (10/08 1549) Immune Varicella: Immue  GTT Type 2 DM RPR: Non Reactive (10/08 1549) negative  Rhogam n/a HBsAg: Negative (10/08 1549) negative  Vaccines TDAP:                       Flu Shot: HIV: Non Reactive (10/08 1549) non reactive  Baby Food Formula                              GBS:   Contraception BTL, consent 30 wks [ ]  Pap: at [facility] June 2020, neg per pt report  CBB  No Hemoglobin A1C 7.7% on 10/14  CS/VBAC 2006 primary for FTP 8#, 2009 repeat 9#10oz   Support Person Partner 11/14- 6'9" tall            Cont current plan  Next 2007 at [facility] 01/02/2020   [place], MD, [facility], [facility] 12/06/2019   3:10 PM

## 2019-12-17 ENCOUNTER — Other Ambulatory Visit: Payer: Self-pay | Admitting: Internal Medicine

## 2019-12-17 ENCOUNTER — Telehealth: Payer: Self-pay | Admitting: Pharmacy Technician

## 2019-12-17 NOTE — Telephone Encounter (Signed)
Patient has Medicaid with prescription drug coverage.  Contacted patient to make aware that patient no longer meets [facility]'s eligibility critieria.  Patient stated that she understood that Deanna Zimmerman can no longer provide medication assistance.  Patient asked for prescriptions to be sent to [facility].  Made Sammuel Hines, Pharmacy Technician, that patient's prescriptions needed to be transferred to [facility].  Sherilyn Dacosta Care Manager [facility]

## 2019-12-23 ENCOUNTER — Encounter: Payer: Self-pay | Admitting: Obstetrics and Gynecology

## 2019-12-23 ENCOUNTER — Other Ambulatory Visit: Payer: Self-pay

## 2019-12-23 ENCOUNTER — Ambulatory Visit (INDEPENDENT_AMBULATORY_CARE_PROVIDER_SITE_OTHER): Payer: Medicaid Other | Admitting: Obstetrics and Gynecology

## 2019-12-23 VITALS — BP 120/70 | Wt 160.0 lb

## 2019-12-23 DIAGNOSIS — O10912 Unspecified pre-existing hypertension complicating pregnancy, second trimester: Secondary | ICD-10-CM

## 2019-12-23 DIAGNOSIS — O24112 Pre-existing diabetes mellitus, type 2, in pregnancy, second trimester: Secondary | ICD-10-CM

## 2019-12-23 DIAGNOSIS — Z3A26 26 weeks gestation of pregnancy: Secondary | ICD-10-CM

## 2019-12-23 DIAGNOSIS — O24119 Pre-existing diabetes mellitus, type 2, in pregnancy, unspecified trimester: Secondary | ICD-10-CM

## 2019-12-23 DIAGNOSIS — O34219 Maternal care for unspecified type scar from previous cesarean delivery: Secondary | ICD-10-CM

## 2019-12-23 DIAGNOSIS — O10919 Unspecified pre-existing hypertension complicating pregnancy, unspecified trimester: Secondary | ICD-10-CM

## 2019-12-23 DIAGNOSIS — O0990 Supervision of high risk pregnancy, unspecified, unspecified trimester: Secondary | ICD-10-CM

## 2019-12-23 DIAGNOSIS — O0992 Supervision of high risk pregnancy, unspecified, second trimester: Secondary | ICD-10-CM

## 2019-12-23 NOTE — Progress Notes (Signed)
Routine Prenatal Care Visit  Subjective  Deanna Zimmerman is a 34 y.o. G3P2002 at [redacted]w[redacted]d being seen today for ongoing prenatal care.  She is currently monitored for the following issues for this high-risk pregnancy and has Diabetes mellitus, new onset (HCC); Hypertension; Goiter; Type 2 diabetes mellitus with hyperglycemia, without long-term current use of insulin (HCC); Supervision of high risk pregnancy, antepartum; Chronic hypertension affecting pregnancy; Previous cesarean delivery affecting pregnancy, antepartum; and Pre-existing type 2 diabetes mellitus during pregnancy, antepartum on their problem list.  ----------------------------------------------------------------------------------- Patient reports no complaints.   Contractions: Not present. Vag. Bleeding: None.  Movement: Present. Denies leaking of fluid.  ----------------------------------------------------------------------------------- The following portions of the patient's history were reviewed and updated as appropriate: allergies, current medications, past family history, past medical history, past social history, past surgical history and problem list. Problem list updated.   Objective  Blood pressure 120/70, weight 160 lb (72.6 kg), last menstrual period 06/18/2019. Pregravid weight 145 lb (65.8 kg) Total Weight Gain 15 lb (6.804 kg) Urinalysis:      Fetal Status: Fetal Heart Rate (bpm): 130 Fundal Height: 26 cm Movement: Present     General:  Alert, oriented and cooperative. Patient is in no acute distress.  Skin: Skin is warm and dry. No rash noted.   Cardiovascular: Normal heart rate noted  Respiratory: Normal respiratory effort, no problems with respiration noted  Abdomen: Soft, gravid, appropriate for gestational age. Pain/Pressure: Absent     Pelvic:  Cervical exam deferred        Extremities: Normal range of motion.  Edema: None  Mental Status: Normal mood and affect. Normal behavior. Normal judgment and  thought content.     Assessment   34 y.o. G3P2002 at [redacted]w[redacted]d by  03/24/2020, by Last Menstrual Period presenting for routine prenatal visit  Plan   pregnancy Problems (from 08/01/19 to present)    Problem Noted Resolved   Chronic hypertension affecting pregnancy 08/13/2019 by Farrel Conners, CNM No   Overview Addendum 11/20/2019 10:35 AM by [name], MD    [x ] Aspirin 81 mg daily after 12 weeks; discontinue after 36 weeks [x ] baseline labs with CBC, CMP, urine protein/creatinine ratio [ ]  no BP meds unless BPs become elevated Labetalol started, 100 BID as of 12/1 [ ]  ultrasound for growth at 28, 32, 36 weeks [ ]  Baseline EKG   Current antihypertensives:  Labetalol 200 mgm BID (Losartan and HCTZ were discontinued)  Baseline and surveillance labs (pulled in from [facility], refresh links as needed)  Lab Results  Component Value Date   PLT 388 08/01/2019   CREATININE 0.56 (L) 08/07/2019   AST 16 08/07/2019   ALT 10 08/07/2019    Antenatal Testing CHTN - O10.919  Group I  BP < 140/90, no preeclampsia, AGA,  nml AFV, +/- meds    Group II BP > 140/90, on meds, no preeclampsia, AGA, nml AFV  20-28-34-38  20-24-28-32-35-38  32//2 x wk  28//BPP wkly then 32//2 x wk  40 no meds; 39 meds  PRN or 37  Pre-eclampsia  GHTN - O13.9/Preeclampsia without severe features  - O14.00   Preeclampsia with severe features - O14.10  Q 3-4wks  Q 2 wks  28//BPP wkly then 32//2 x wk  Inpatient  37  PRN or 34   UPDATED PLAN: (1/27) MonthlyUSgrowth surveillance beginning at 28 weeksfollowed by weekly antenatal testing beginning at 32 weeks and twice weekly testing beginning at 34 weekswith delivery at 37-39 weeks depending on  maternal and fetal status.      Previous cesarean delivery affecting pregnancy, antepartum 08/13/2019 by Farrel Conners, CNM No   Overview Addendum 11/20/2019 10:34 AM by [name], MD    Hx of 2 Cesarean sections Plan CS 39 weeks Desires BTL  as well      Pre-existing type 2 diabetes mellitus during pregnancy, antepartum 08/13/2019 by Farrel Conners, CNM No   Overview Addendum 11/16/2019 10:23 AM by Farrel Conners, CNM    Current Diabetic Medications:  Lantus Insulin,  Metformin    Long-acting   * Lantus 24 units    * Metformin [x ] Aspirin 81 mg daily after 12 weeks; discontinue after 36 weeks (? A2/B GDM)  Required Referrals for A1GDM or A2GDM: [ ]  Diabetes Education and Testing Supplies [ ]  Nutrition Cousult  For A2/B GDM or higher classes of DM [x ] Diabetes Education and Testing Supplies [x ] Nutrition Counsult [x ] Fetal ECHO after 22-24 weeks (fetal echo normal on 1/21)  [x ] Eye exam for retina evaluation  [x ] Baseline EKG [ x] fetal growth every 4 weeks starting at 28 weeks [x ] Twice weekly NST starting at [redacted] weeks gestation [x ] Delivery planning contingent on fetal growth, AFI, glycemic control, and other co-morbidities but at least by 39 weeks  Baseline and surveillance labs (pulled in from [facility], refresh links as needed)  Lab Results  Component Value Date   CREATININE 0.56 (L) 08/07/2019   AST 16 08/07/2019   ALT 10 08/07/2019   TSH 0.841 09/26/2018   Lab Results  Component Value Date   HGBA1C 7.7 (H) 08/07/2019   HGBA1C 7.7 (H) 05/29/2019   HGBA1C 8.8 (H) 04/10/2019    Antenatal Testing Class of DM U/S NST/AFI DELIVERY  Diabetes   A1 - good control - O24.410    A2 - good control - O24.419      A2  - poor control or poor compliance - O24.419, E11.65   (Macrosomia or polyhydramnios) **E11.65 is extra code for poor control**    A2/B - O24.919  and B-C O24.319  Poor control B-C or D-R-F-T - O24.319  or  Type I DM - O24.019  20-38  20-38  20-24-28-32-36   20-24-28-32-35-38//fetal echo  20-24-27-30-33-36-38//fetal echo  40  32//2 x wk  32//2 x wk   32//2 x wk  28//BPP wkly then 32//2 x wk  40  39  PRN   39  PRN   Has Bionime glucometer 10/14 hemoglobin  A1c was 7.7%      Supervision of high risk pregnancy, antepartum 08/01/2019 by 11/14, CNM No   Overview Addendum 11/20/2019 10:34 AM by [name], MD    Clinic [facility]atal [facility]  Dating 11/22/2019 Blood type: O/Positive/-- (10/08 1549) O POSITIVE  Genetic Screen declines Antibody:Negative (10/08 1549)negative  Anatomic 12-25[birth year]1986 DP MFM Rubella: 3.99 (10/08 1549) Immune Varicella: Immue  GTT Type 2 DM RPR: Non Reactive (10/08 1549) negative  Rhogam n/a HBsAg: Negative (10/08 1549) negative  Vaccines TDAP:                       Flu Shot: HIV: Non Reactive (10/08 1549) non reactive  Baby Food Formula                              GBS:   Contraception BTL, consent 30 wks [ ]  Pap: at [facility] June  2020, neg per pt report  CBB  No Hemoglobin A1C 7.7% on 10/14  CS/VBAC 2006 primary for FTP 8#, 2009 repeat 9#10oz   Support Person Partner Sonia Side- 6'9" tall              Gestational age appropriate obstetric precautions including but not limited to vaginal bleeding, contractions, leaking of fluid and fetal movement were reviewed in detail with the patient.    Recommended increasing dinnertime Humalog to 8 units with meals from 6 units.  Continue 26 units long-acting and 6 units short acting with breakfast and dinner.  Continue labetalol.  Orders for 28-week labs placed  No follow-ups on file.  Homero Fellers MD [facility], [facility] 12/23/2019, 4:21 PM

## 2019-12-23 NOTE — Progress Notes (Signed)
[facility]OB No concerns Denies lof, no vb, Good FM

## 2019-12-30 ENCOUNTER — Other Ambulatory Visit: Payer: Self-pay | Admitting: Maternal & Fetal Medicine

## 2019-12-30 DIAGNOSIS — E669 Obesity, unspecified: Secondary | ICD-10-CM

## 2019-12-30 DIAGNOSIS — O10919 Unspecified pre-existing hypertension complicating pregnancy, unspecified trimester: Secondary | ICD-10-CM

## 2020-01-02 ENCOUNTER — Ambulatory Visit
Admission: RE | Admit: 2020-01-02 | Discharge: 2020-01-02 | Disposition: A | Discharge status: Home / Self Care | Payer: Medicaid Other | Source: Ambulatory Visit | Attending: Obstetrics and Gynecology | Admitting: Obstetrics and Gynecology

## 2020-01-02 ENCOUNTER — Ambulatory Visit (HOSPITAL_BASED_OUTPATIENT_CLINIC_OR_DEPARTMENT_OTHER)
Admission: RE | Admit: 2020-01-02 | Discharge: 2020-01-02 | Disposition: A | Discharge status: Home / Self Care | Payer: Medicaid Other | Source: Ambulatory Visit | Attending: Obstetrics and Gynecology | Admitting: Obstetrics and Gynecology

## 2020-01-02 ENCOUNTER — Other Ambulatory Visit: Payer: Self-pay

## 2020-01-02 DIAGNOSIS — O34219 Maternal care for unspecified type scar from previous cesarean delivery: Secondary | ICD-10-CM

## 2020-01-02 DIAGNOSIS — E66811 Obesity, class 1: Secondary | ICD-10-CM

## 2020-01-02 DIAGNOSIS — O10919 Unspecified pre-existing hypertension complicating pregnancy, unspecified trimester: Secondary | ICD-10-CM

## 2020-01-02 DIAGNOSIS — Z794 Long term (current) use of insulin: Secondary | ICD-10-CM | POA: Insufficient documentation

## 2020-01-02 DIAGNOSIS — O0990 Supervision of high risk pregnancy, unspecified, unspecified trimester: Secondary | ICD-10-CM

## 2020-01-02 DIAGNOSIS — Z79899 Other long term (current) drug therapy: Secondary | ICD-10-CM | POA: Diagnosis not present

## 2020-01-02 DIAGNOSIS — Z3A28 28 weeks gestation of pregnancy: Secondary | ICD-10-CM | POA: Diagnosis not present

## 2020-01-02 DIAGNOSIS — O10013 Pre-existing essential hypertension complicating pregnancy, third trimester: Secondary | ICD-10-CM | POA: Insufficient documentation

## 2020-01-02 DIAGNOSIS — O24113 Pre-existing diabetes mellitus, type 2, in pregnancy, third trimester: Secondary | ICD-10-CM | POA: Diagnosis not present

## 2020-01-02 DIAGNOSIS — O24119 Pre-existing diabetes mellitus, type 2, in pregnancy, unspecified trimester: Secondary | ICD-10-CM

## 2020-01-02 DIAGNOSIS — O24919 Unspecified diabetes mellitus in pregnancy, unspecified trimester: Secondary | ICD-10-CM | POA: Diagnosis not present

## 2020-01-02 DIAGNOSIS — E669 Obesity, unspecified: Secondary | ICD-10-CM

## 2020-01-02 NOTE — Progress Notes (Signed)
[facility]    HPI:Deanna Zimmerman a 34 y.o.G3P2002 at26w2dgestation AAF who works at [employer] who presentsfor follow-upconsultation for glycemic review due to T2 DM. She is currently taking Lantus 26u at HS,6 u of Humalog with breakfast and 6 U of Humalog with lunch and humlog 8 u  dinner as well as metformin 1000mg  bid.Her pregnancy is also complicated by hypertension.She is on labetalol 100mg  bid for cHTN.   Blood glucose log Fastings73-105 with 2/14 > 95 2 h post breakfast:  66-208 with 5/14 > 120 2h post lunch:50-147 with 3/13 > 120- sxs at 50 missed some food  2h post dinner:85--166 With 7/13 >120 improved after Schuman increased pm humalog to 8        Medication Sig  . Insulin Glargine (LANTUS SOLOSTAR) 100 UNIT/ML Solostar Pen Inject 26 Units into the skin at bedtime.  . insulin lispro (HUMALOG) 100 UNIT/ML injection 6 unitswith br/lunch, 8 with supper  . labetalol (NORMODYNE) 100 MG tablet Take 1 tablet (100 mg total) by mouth 2 (two) times daily.  . metFORMIN (GLUCOPHAGE) 1000 MG tablet Take 1 tablet (1,000 mg total) by mouth 2 (two) times daily with a meal.  . Prenatal Vit-Fe Fumarate-FA (PRENATAL VITAMIN PO) Take by mouth.   Physical Exam: 116/68 108 97.2 O2 sat 99% 159   RBS today is 157 1 hour after her most recent meal   Asessement: 1. Previous cesarean delivery affecting pregnancy, antepartum   2. Pre-existing type 2 diabetes mellitus during pregnancy in first trimester   3. Chronic hypertension affecting pregnancy    4/13 today EFW 85%  Plan: Type 2 DM:            - Increase Humalog to 8 units with each meal and Lantus to 26U qhs. continue metformin             - appreciate adjustments at [facility]             - f/up consult/review of BS and BPs with next growth ultrasound at 32 weeks unless indicated earlier            - Fetal echo - [facility]  -  normal  - add hgb A1c with third trimester labs             -MonthlyUSgrowth surveillance beginning followed by weekly antenatal testing beginning at 32 weeks and twice weekly testing beginning at 34 weekswith delivery at 37-39 weeks depending on maternal and fetal status.  CHTN             - see our previous recommendations continue labetalol and aspirin            -She was previously prescribed a standard (arm) BP cuff and will continue taking her BPs.  She has been given guidelines for when to call with abnormal BP or symptoms of preeclampsia.             -Fetalsurveillance as outlinedabove.  Total time spent with the patient was62minutes with greater than 50% spent in counseling and coordination of care.  We appreciate this consult and will be happy to be involved in the ongoing care of DeannaDeanna Zimmerman anyway her obstetricians desire.  [facility], MD

## 2020-01-06 ENCOUNTER — Other Ambulatory Visit: Payer: Self-pay

## 2020-01-06 ENCOUNTER — Ambulatory Visit (INDEPENDENT_AMBULATORY_CARE_PROVIDER_SITE_OTHER): Payer: Medicaid Other | Admitting: Obstetrics and Gynecology

## 2020-01-06 VITALS — BP 112/72 | Wt 160.0 lb

## 2020-01-06 DIAGNOSIS — E1165 Type 2 diabetes mellitus with hyperglycemia: Secondary | ICD-10-CM

## 2020-01-06 DIAGNOSIS — O34219 Maternal care for unspecified type scar from previous cesarean delivery: Secondary | ICD-10-CM

## 2020-01-06 DIAGNOSIS — O10919 Unspecified pre-existing hypertension complicating pregnancy, unspecified trimester: Secondary | ICD-10-CM

## 2020-01-06 DIAGNOSIS — O24119 Pre-existing diabetes mellitus, type 2, in pregnancy, unspecified trimester: Secondary | ICD-10-CM

## 2020-01-06 DIAGNOSIS — O0993 Supervision of high risk pregnancy, unspecified, third trimester: Secondary | ICD-10-CM

## 2020-01-06 DIAGNOSIS — O0990 Supervision of high risk pregnancy, unspecified, unspecified trimester: Secondary | ICD-10-CM

## 2020-01-06 DIAGNOSIS — O10913 Unspecified pre-existing hypertension complicating pregnancy, third trimester: Secondary | ICD-10-CM

## 2020-01-06 DIAGNOSIS — Z3A28 28 weeks gestation of pregnancy: Secondary | ICD-10-CM

## 2020-01-06 DIAGNOSIS — E119 Type 2 diabetes mellitus without complications: Secondary | ICD-10-CM

## 2020-01-06 DIAGNOSIS — Z113 Encounter for screening for infections with a predominantly sexual mode of transmission: Secondary | ICD-10-CM

## 2020-01-06 DIAGNOSIS — O24113 Pre-existing diabetes mellitus, type 2, in pregnancy, third trimester: Secondary | ICD-10-CM

## 2020-01-06 LAB — POCT URINALYSIS DIPSTICK OB
Glucose, UA: NEGATIVE
POC,PROTEIN,UA: NEGATIVE

## 2020-01-06 LAB — GLUCOSE, CAPILLARY: Glucose-Capillary: 157 mg/dL — ABNORMAL HIGH (ref 70–99)

## 2020-01-06 NOTE — Progress Notes (Signed)
ROB

## 2020-01-06 NOTE — Progress Notes (Signed)
Routine Prenatal Care Visit  Subjective  Deanna Zimmerman is a 34 y.o. G3P2002 at [redacted]w[redacted]d being seen today for ongoing prenatal care.  She is currently monitored for the following issues for this high-risk pregnancy and has Diabetes mellitus, new onset (HCC); Hypertension; Goiter; Type 2 diabetes mellitus with hyperglycemia, without long-term current use of insulin (HCC); Supervision of high risk pregnancy, antepartum; Chronic hypertension affecting pregnancy; Previous cesarean delivery affecting pregnancy, antepartum; and Pre-existing type 2 diabetes mellitus during pregnancy, antepartum on their problem list.  ----------------------------------------------------------------------------------- Patient reports no complaints.   Contractions: Not present. Vag. Bleeding: None.  Movement: Present. Denies leaking of fluid.  ----------------------------------------------------------------------------------- The following portions of the patient's history were reviewed and updated as appropriate: allergies, current medications, past family history, past medical history, past social history, past surgical history and problem list. Problem list updated.   Objective  Blood pressure 112/72, weight 160 lb (72.6 kg), last menstrual period 06/18/2019. Pregravid weight 145 lb (65.8 kg) Total Weight Gain 15 lb (6.804 kg) Urinalysis:      Fetal Status: Fetal Heart Rate (bpm): 140 Fundal Height: 30 cm Movement: Present     General:  Alert, oriented and cooperative. Patient is in no acute distress.  Skin: Skin is warm and dry. No rash noted.   Cardiovascular: Normal heart rate noted  Respiratory: Normal respiratory effort, no problems with respiration noted  Abdomen: Soft, gravid, appropriate for gestational age. Pain/Pressure: Absent     Pelvic:  Cervical exam deferred        Extremities: Normal range of motion.     ental Status: Normal mood and affect. Normal behavior. Normal judgment and thought  content.    Assessment   34 y.o. [id number] at [redacted]w[redacted]d by  03/24/2020, by Last Menstrual Period presenting for routine prenatal visit  Plan   pregnancy Problems (from 08/01/19 to present)    Problem Noted Resolved   Chronic hypertension affecting pregnancy 08/13/2019 by Farrel Conners, CNM No   Overview Addendum 11/20/2019 10:35 AM by [name], MD    [x ] Aspirin 81 mg daily after 12 weeks; discontinue after 36 weeks [x ] baseline labs with CBC, CMP, urine protein/creatinine ratio [ ]  no BP meds unless BPs become elevated Labetalol started, 100 BID as of 12/1 [ ]  ultrasound for growth at 28, 32, 36 weeks [ ]  Baseline EKG   Current antihypertensives:  Labetalol 200 mgm BID (Losartan and HCTZ were discontinued)  Baseline and surveillance labs (pulled in from [facility], refresh links as needed)  Lab Results  Component Value Date   PLT 388 08/01/2019   CREATININE 0.56 (L) 08/07/2019   AST 16 08/07/2019   ALT 10 08/07/2019    Antenatal Testing CHTN - O10.919  Group I  BP < 140/90, no preeclampsia, AGA,  nml AFV, +/- meds    Group II BP > 140/90, on meds, no preeclampsia, AGA, nml AFV  20-28-34-38  20-24-28-32-35-38  32//2 x wk  28//BPP wkly then 32//2 x wk  40 no meds; 39 meds  PRN or 37  Pre-eclampsia  GHTN - O13.9/Preeclampsia without severe features  - O14.00   Preeclampsia with severe features - O14.10  Q 3-4wks  Q 2 wks  28//BPP wkly then 32//2 x wk  Inpatient  37  PRN or 34   UPDATED PLAN: (1/27) MonthlyUSgrowth surveillance beginning at 28 weeksfollowed by weekly antenatal testing beginning at 32 weeks and twice weekly testing beginning at 34 weekswith delivery at 37-39 weeks depending on maternal  and fetal status.      Previous cesarean delivery affecting pregnancy, antepartum 08/13/2019 by Farrel Conners, CNM No   Overview Addendum 11/20/2019 10:34 AM by [name], MD    Hx of 2 Cesarean sections Plan CS 39 weeks Desires BTL as well       Pre-existing type 2 diabetes mellitus during pregnancy, antepartum 08/13/2019 by Farrel Conners, CNM No   Overview Addendum 11/16/2019 10:23 AM by Farrel Conners, CNM    Current Diabetic Medications:  Lantus Insulin,  Metformin    Long-acting   * Lantus 24 units    * Metformin [x ] Aspirin 81 mg daily after 12 weeks; discontinue after 36 weeks (? A2/B GDM)  Required Referrals for A1GDM or A2GDM: [ ]  Diabetes Education and Testing Supplies [ ]  Nutrition Cousult  For A2/B GDM or higher classes of DM [x ] Diabetes Education and Testing Supplies [x ] Nutrition Counsult [x ] Fetal ECHO after 22-24 weeks (fetal echo normal on 1/21)  [x ] Eye exam for retina evaluation  [x ] Baseline EKG [ x] fetal growth every 4 weeks starting at 28 weeks [x ] Twice weekly NST starting at [redacted] weeks gestation [x ] Delivery planning contingent on fetal growth, AFI, glycemic control, and other co-morbidities but at least by 39 weeks  Baseline and surveillance labs (pulled in from [facility], refresh links as needed)  Lab Results  Component Value Date   CREATININE 0.56 (L) 08/07/2019   AST 16 08/07/2019   ALT 10 08/07/2019   TSH 0.841 09/26/2018   Lab Results  Component Value Date   HGBA1C 7.7 (H) 08/07/2019   HGBA1C 7.7 (H) 05/29/2019   HGBA1C 8.8 (H) 04/10/2019    Antenatal Testing Class of DM U/S NST/AFI DELIVERY  Diabetes   A1 - good control - O24.410    A2 - good control - O24.419      A2  - poor control or poor compliance - O24.419, E11.65   (Macrosomia or polyhydramnios) **E11.65 is extra code for poor control**    A2/B - O24.919  and B-C O24.319  Poor control B-C or D-R-F-T - O24.319  or  Type I DM - O24.019  20-38  20-38  20-24-28-32-36   20-24-28-32-35-38//fetal echo  20-24-27-30-33-36-38//fetal echo  40  32//2 x wk  32//2 x wk   32//2 x wk  28//BPP wkly then 32//2 x wk  40  39  PRN   39  PRN   Has Bionime glucometer 10/14 hemoglobin A1c was  7.7%      Supervision of high risk pregnancy, antepartum 08/01/2019 by 11/14, CNM No   Overview Addendum 11/20/2019 10:34 AM by [name], MD    [facility]inic [facility]  Dating 11/22/2019 Blood type: O/Positive/-- (10/08 1549) O POSITIVE  Genetic Screen declines Antibody:Negative (10/08 1549)negative  Anatomic [birth year]1986 DP MFM Rubella: 3.99 (10/08 1549) Immune Varicella: Immue  GTT Type 2 DM RPR: Non Reactive (10/08 1549) negative  Rhogam n/a HBsAg: Negative (10/08 1549) negative  Vaccines TDAP:                       Flu Shot: HIV: Non Reactive (10/08 1549) non reactive  Baby Food Formula                              GBS:   Contraception BTL, consent 30 wks [ ]  Pap: at [facility] June 2020,  neg per pt report  CBB  No Hemoglobin A1C 7.7% on 10/14  CS/VBAC 2006 primary for FTP 8#, 2009 repeat 9#10oz   Support Person Partner Sonia Side- 6'9" tall              Gestational age appropriate obstetric precautions including but not limited to vaginal bleeding, contractions, leaking of fluid and fetal movement were reviewed in detail with the patient.    1) DM II - currently on 26U long acting, and short acting 6-6-8.  BG log reviewed with values showing good improvement in dinner time values after increasing short acting dinner.  Remaining values normal with the exception of a few elevated outlier but not clear trend.   - HGBA1C drawn with 28 week labs    Return in about 1 week (around 01/13/2020) for ROB.  Malachy Mood, MD, [facility], [facility] 01/06/2020, 4:23 PM

## 2020-01-07 LAB — HEMOGLOBIN A1C
Est. average glucose Bld gHb Est-mCnc: 111 mg/dL
Hgb A1c MFr Bld: 5.5 % (ref 4.8–5.6)

## 2020-01-07 LAB — CBC
Hematocrit: 29 % — ABNORMAL LOW (ref 34.0–46.6)
Hemoglobin: 9.5 g/dL — ABNORMAL LOW (ref 11.1–15.9)
MCH: 29 pg (ref 26.6–33.0)
MCHC: 32.8 g/dL (ref 31.5–35.7)
MCV: 88 fL (ref 79–97)
Platelets: 327 10*3/uL (ref 150–450)
RBC: 3.28 x10E6/uL — ABNORMAL LOW (ref 3.77–5.28)
RDW: 12.3 % (ref 11.7–15.4)
WBC: 9 10*3/uL (ref 3.4–10.8)

## 2020-01-07 LAB — RPR: RPR Ser Ql: NONREACTIVE

## 2020-01-07 LAB — HIV ANTIBODY (ROUTINE TESTING W REFLEX): HIV Screen 4th Generation wRfx: NONREACTIVE

## 2020-01-13 ENCOUNTER — Other Ambulatory Visit: Payer: Self-pay

## 2020-01-13 ENCOUNTER — Encounter: Payer: Self-pay | Admitting: Obstetrics and Gynecology

## 2020-01-13 ENCOUNTER — Ambulatory Visit (INDEPENDENT_AMBULATORY_CARE_PROVIDER_SITE_OTHER): Payer: Medicaid Other | Admitting: Obstetrics and Gynecology

## 2020-01-13 VITALS — BP 120/70 | Wt 159.0 lb

## 2020-01-13 DIAGNOSIS — O0993 Supervision of high risk pregnancy, unspecified, third trimester: Secondary | ICD-10-CM

## 2020-01-13 DIAGNOSIS — E1165 Type 2 diabetes mellitus with hyperglycemia: Secondary | ICD-10-CM

## 2020-01-13 DIAGNOSIS — O24119 Pre-existing diabetes mellitus, type 2, in pregnancy, unspecified trimester: Secondary | ICD-10-CM

## 2020-01-13 DIAGNOSIS — O34219 Maternal care for unspecified type scar from previous cesarean delivery: Secondary | ICD-10-CM

## 2020-01-13 DIAGNOSIS — R350 Frequency of micturition: Secondary | ICD-10-CM

## 2020-01-13 DIAGNOSIS — O24113 Pre-existing diabetes mellitus, type 2, in pregnancy, third trimester: Secondary | ICD-10-CM

## 2020-01-13 DIAGNOSIS — Z3A29 29 weeks gestation of pregnancy: Secondary | ICD-10-CM

## 2020-01-13 DIAGNOSIS — O0990 Supervision of high risk pregnancy, unspecified, unspecified trimester: Secondary | ICD-10-CM

## 2020-01-13 NOTE — Progress Notes (Signed)
Routine Prenatal Care Visit  Subjective  Deanna Zimmerman is a 34 y.o. G3P2002 at [redacted]w[redacted]d being seen today for ongoing prenatal care.  She is currently monitored for the following issues for this low-risk pregnancy and has Diabetes mellitus, new onset (HCC); Hypertension; Goiter; Type 2 diabetes mellitus with hyperglycemia, without long-term current use of insulin (HCC); Supervision of high risk pregnancy, antepartum; Chronic hypertension affecting pregnancy; Previous cesarean delivery affecting pregnancy, antepartum; and Pre-existing type 2 diabetes mellitus during pregnancy, antepartum on their problem list.  ----------------------------------------------------------------------------------- Patient reports that over the weekend she had several episodes of low blood sugars [place] the mall. She had to take glucose tablets, drink pepsi and eat food to feel better. She has been taking 8 units of short acting with all of her meals.  She is eating three meals a day and three snacks. Contractions: Not present. Vag. Bleeding: None.  Movement: Present. Denies leaking of fluid.  ----------------------------------------------------------------------------------- The following portions of the patient's history were reviewed and updated as appropriate: allergies, current medications, past family history, past medical history, past social history, past surgical history and problem list. Problem list updated.   Objective  Blood pressure 120/70, weight 159 lb (72.1 kg), last menstrual period 06/18/2019. Pregravid weight 145 lb (65.8 kg) Total Weight Gain 14 lb (6.35 kg) Urinalysis:      Fetal Status: Fetal Heart Rate (bpm): 135 Fundal Height: 30 cm Movement: Present     General:  Alert, oriented and cooperative. Patient is in no acute distress.  Skin: Skin is warm and dry. No rash noted.   Cardiovascular: Normal heart rate noted  Respiratory: Normal respiratory effort, no problems with  respiration noted  Abdomen: Soft, gravid, appropriate for gestational age. Pain/Pressure: Absent     Pelvic:  Cervical exam deferred        Extremities: Normal range of motion.  Edema: None  Mental Status: Normal mood and affect. Normal behavior. Normal judgment and thought content.         01/06/2020 79  112 78 101  01/07/2020 74 76 99 123  01/08/2020 77 120 56 124  01/09/2020 83 127 105 108  01/10/2020 85 11 83 145  01/11/2020 92 123 72 116  01/12/2020 98 119 69 136  01/13/2020 97 107       Assessment   34 y.o. G3P2002 at [redacted]w[redacted]d by  03/24/2020, by Last Menstrual Period presenting for routine prenatal visit  Plan   pregnancy Problems (from 08/01/19 to present)    Problem Noted Resolved   Chronic hypertension affecting pregnancy 08/13/2019 by Farrel Conners, CNM No   Overview Addendum 11/20/2019 10:35 AM by [name], MD    [x ] Aspirin 81 mg daily after 12 weeks; discontinue after 36 weeks [x ] baseline labs with CBC, CMP, urine protein/creatinine ratio [ ]  no BP meds unless BPs become elevated Labetalol started, 100 BID as of 12/1 [ ]  ultrasound for growth at 28, 32, 36 weeks [ ]  Baseline EKG   Current antihypertensives:  Labetalol 200 mgm BID (Losartan and HCTZ were discontinued)  Baseline and surveillance labs (pulled in from [facility], refresh links as needed)  Lab Results  Component Value Date   PLT 388 08/01/2019   CREATININE 0.56 (L) 08/07/2019   AST 16 08/07/2019   ALT 10 08/07/2019    Antenatal Testing CHTN - O10.919  Group I  BP < 140/90, no preeclampsia, AGA,  nml AFV, +/- meds    Group II BP >  140/90, on meds, no preeclampsia, AGA, nml AFV  20-28-34-38  20-24-28-32-35-38  32//2 x wk  28//BPP wkly then 32//2 x wk  40 no meds; 39 meds  PRN or 37  Pre-eclampsia  GHTN - O13.9/Preeclampsia without severe features  - O14.00   Preeclampsia with severe features - O14.10  Q 3-4wks  Q 2 wks  28//BPP wkly then 32//2 x wk  Inpatient  37  PRN or 34    UPDATED PLAN: (1/27) MonthlyUSgrowth surveillance beginning at 28 weeksfollowed by weekly antenatal testing beginning at 32 weeks and twice weekly testing beginning at 44 weekswith delivery at 37-39 weeks depending on maternal and fetal status.      Previous cesarean delivery affecting pregnancy, antepartum 08/13/2019 by [name], CNM No   Overview Addendum 11/20/2019 10:34 AM by [name], MD    Hx of 2 Cesarean sections Plan CS 39 weeks Desires BTL as well      Pre-existing type 2 diabetes mellitus during pregnancy, antepartum 08/13/2019 by [name], CNM No   Overview Addendum 11/16/2019 10:23 AM by [name], CNM    Current Diabetic Medications:  Lantus Insulin,  Metformin    Long-acting   * Lantus 24 units    * Metformin [x ] Aspirin 81 mg daily after 12 weeks; discontinue after 36 weeks (? A2/B GDM)  Required Referrals for A1GDM or A2GDM: [ ]  Diabetes Education and Testing Supplies [ ]  Nutrition Cousult  For A2/B GDM or higher classes of DM [x ] Diabetes Education and Testing Supplies [x ] Nutrition Counsult [x ] Fetal ECHO after 22-24 weeks (fetal echo normal on 1/21)  [x ] Eye exam for retina evaluation  [x ] Baseline EKG [ x] US fetal growth every 4 weeks starting at 28 weeks [x ] Twice weekly NST starting at [redacted] weeks gestation [x ] Delivery planning contingent on fetal growth, AFI, glycemic control, and other co-morbidities but at least by 39 weeks  Baseline and surveillance labs (pulled in from [facility], refresh links as needed)  Lab Results  Component Value Date   CREATININE 0.56 (L) 08/07/2019   AST 16 08/07/2019   ALT 10 08/07/2019   TSH 0.841 09/26/2018   Lab Results  Component Value Date   HGBA1C 7.7 (H) 08/07/2019   HGBA1C 7.7 (H) 05/29/2019   HGBA1C 8.8 (H) 04/10/2019    Antenatal Testing Class of DM U/S NST/AFI DELIVERY  Diabetes   A1 - good control - O24.410    A2 - good control - O24.419      A2  - poor  control or poor compliance - O24.419, E11.65   (Macrosomia or polyhydramnios) **E11.65 is extra code for poor control**    A2/B - O24.919  and B-C O24.319  Poor control B-C or D-R-F-T - O24.319  or  Type I DM - O24.019  20-38  20-38  20-24-28-32-36   20-24-28-32-35-38//fetal echo  20-24-27-30-33-36-38//fetal echo  40  32//2 x wk  32//2 x wk   32//2 x wk  28//BPP wkly then 32//2 x wk  40  39  PRN   39  PRN   Has Bionime glucometer 10/14 hemoglobin A1c was 7.7%      Supervision of high risk pregnancy, antepartum 08/01/2019 by Rod Can, CNM No   Overview Addendum 01/13/2020  4:42 PM by Homero Fellers, MD    Clinic [facility] Prenatal Labs  Dating [place] Blood type: O/Positive/-- (10/08 1549) O POSITIVE  Genetic Screen declines Antibody:Negative (10/08 1549)negative  Anatomic [place]  DP MFM Rubella: 3.99 (10/08 1549) Immune Varicella: Immue  GTT Type 2 DM RPR: Non Reactive (10/08 1549) negative  Rhogam n/a HBsAg: Negative (10/08 1549) negative  Vaccines TDAP:                       Flu Shot: HIV: Non Reactive (10/08 1549) non reactive  Baby Food Formula                              GBS:   Contraception BTL, consent 30 wks [x]  01/13/2020 Pap: at [facility] June 2020, neg per pt report  CBB  No Hemoglobin A1C 7.7% on 10/14  CS/VBAC 2006 primary for FTP 8#, 2009 repeat 9#10oz Repeat scheduled for 03/19/2020   Support Person Partner 03/21/2020- 6'9" tall              She has been maintaining excellent glucose control. Given hypoglycemic episodes will reduce the amount of her short acting insulin. Advised to come to the hospital or call EMS if these episodes reoccur.   Change short acting insulin to:  6 units breakfast 4 units lunch 8 units dinner Continue 26 units long acting  Discussed options for delivery. Given that patient is a well controlled Type 2 Diabetic will plan for delivery at [redacted] weeks gestational age. Patient Scheduled for repeat cesarean section on  03/19/2020. Tubal consents signed today.   Gestational age appropriate obstetric precautions including but not limited to vaginal bleeding, contractions, leaking of fluid and fetal movement were reviewed in detail with the patient.    Return in about 1 week (around 01/20/2020) for ROB in person.  01/22/2020 MD [facility], [facility] 01/13/2020, 10:18 PM

## 2020-01-13 NOTE — Progress Notes (Signed)
ROB C/o back pain, sugar very low yesterday, and threw up yesterday  Denies lof, no vb, Good FM

## 2020-01-15 ENCOUNTER — Telehealth: Payer: Self-pay | Admitting: Obstetrics and Gynecology

## 2020-01-15 NOTE — Telephone Encounter (Signed)
Pt rtn call to sch appt   DOS C/S 5/27 w Dr Jerene Pitch  H&P 5/14 @ 2:50  Covid 5/25 9-10am [address], drive up and wear a mask. Adv pt to quar after tested until DOS.  Adv pt of Pre-admit appt and that date and time will be listed on MYChart (conf use) and also can expect calls from [employer] and pre-service ctr.  Conf pt has Medicaid.  Pt asked if partner will be allowed to come in during surgery. I adv pt to ask provider at next appt on 3/29.

## 2020-01-15 NOTE — Telephone Encounter (Signed)
-----   Message from Natale Milch, MD sent at 01/13/2020  4:37 PM EDT ----- Surgery Booking Request Patient Full Name:  AMARYA KUEHL  MRN: [id number]  DOB: 02/28/1986  Surgeon: Natale Milch, MD  Requested Surgery Date and Time: 03/19/2020 Primary Diagnosis AND Code: hx of prior cesarean section- O34.219 Secondary Diagnosis and Code:  Surgical Procedure: Cesarean Section L&D Notification: No Admission Status: surgery admit Length of Surgery: 1 hour Special Case Needs: No H&P: Yes Phone Interview???:  No Interpreter: No Language:  Medical Clearance:  No Special Scheduling Instructions: none Any known health/anesthesia issues, diabetes, sleep apnea, latex allergy, defibrillator/pacemaker?: No Acuity: P3   (P1 highest, P2 delay may cause harm, P3 low, elective gyn, P4 lowest)

## 2020-01-15 NOTE — Telephone Encounter (Signed)
LM for pt to rtn call. 

## 2020-01-16 LAB — URINE CULTURE

## 2020-01-20 ENCOUNTER — Other Ambulatory Visit: Payer: Self-pay | Admitting: Obstetrics and Gynecology

## 2020-01-20 ENCOUNTER — Other Ambulatory Visit: Payer: Self-pay

## 2020-01-20 ENCOUNTER — Ambulatory Visit (INDEPENDENT_AMBULATORY_CARE_PROVIDER_SITE_OTHER): Payer: Medicaid Other | Admitting: Advanced Practice Midwife

## 2020-01-20 VITALS — BP 100/60 | Wt 157.0 lb

## 2020-01-20 DIAGNOSIS — O09893 Supervision of other high risk pregnancies, third trimester: Secondary | ICD-10-CM

## 2020-01-20 DIAGNOSIS — O0990 Supervision of high risk pregnancy, unspecified, unspecified trimester: Secondary | ICD-10-CM

## 2020-01-20 DIAGNOSIS — O34211 Maternal care for low transverse scar from previous cesarean delivery: Secondary | ICD-10-CM

## 2020-01-20 DIAGNOSIS — O24113 Pre-existing diabetes mellitus, type 2, in pregnancy, third trimester: Secondary | ICD-10-CM

## 2020-01-20 DIAGNOSIS — Z23 Encounter for immunization: Secondary | ICD-10-CM

## 2020-01-20 DIAGNOSIS — Z3A30 30 weeks gestation of pregnancy: Secondary | ICD-10-CM

## 2020-01-20 DIAGNOSIS — O10013 Pre-existing essential hypertension complicating pregnancy, third trimester: Secondary | ICD-10-CM

## 2020-01-20 DIAGNOSIS — O24419 Gestational diabetes mellitus in pregnancy, unspecified control: Secondary | ICD-10-CM

## 2020-01-20 NOTE — Progress Notes (Signed)
Routine Prenatal Care Visit  Subjective  Deanna Zimmerman is a 34 y.o. G3P2002 at [redacted]w[redacted]d being seen today for ongoing prenatal care.  She is currently monitored for the following issues for this high-risk pregnancy and has Diabetes mellitus, new onset (HCC); Hypertension; Goiter; Type 2 diabetes mellitus with hyperglycemia, without long-term current use of insulin (HCC); Supervision of high risk pregnancy, antepartum; Chronic hypertension affecting pregnancy; Previous cesarean delivery affecting pregnancy, antepartum; and Pre-existing type 2 diabetes mellitus during pregnancy, antepartum on their problem list.  ----------------------------------------------------------------------------------- Patient reports no complaints. We discussed blood sugar log- elevated levels and adjusting with meal units. She reports having had snacks at bedtime on Friday and just laying around on the weekend. She denies any hypoglycemic incidents since her last visit. She is taking her insulin as prescribed from last visit. She is taking her blood pressure medicine as prescribed. Contractions: Not present. Vag. Bleeding: None.  Movement: Present. Leaking Fluid denies.  ----------------------------------------------------------------------------------- The following portions of the patient's history were reviewed and updated as appropriate: allergies, current medications, past family history, past medical history, past social history, past surgical history and problem list. Problem list updated.  Objective  Blood pressure 100/60, weight 157 lb (71.2 kg), last menstrual period 06/18/2019. Pregravid weight 145 lb (65.8 kg) Total Weight Gain 12 lb (5.443 kg) Urinalysis: Urine Protein    Urine Glucose    Fetal Status: Fetal Heart Rate (bpm): 130 Fundal Height: 31 cm Movement: Present      Blood sugar log: only 3 days of values Saturday: 101/158/144/117 Sunday: 98/139/92/146 Monday: 90  General:  Alert, oriented and  cooperative. Patient is in no acute distress.  Skin: Skin is warm and dry. No rash noted.   Cardiovascular: Normal heart rate noted  Respiratory: Normal respiratory effort, no problems with respiration noted  Abdomen: Soft, gravid, appropriate for gestational age. Pain/Pressure: Present     Pelvic:  Cervical exam deferred        Extremities: Normal range of motion.  Edema: None  Mental Status: Normal mood and affect. Normal behavior. Normal judgment and thought content.   Assessment   34 y.o. G3P2002 at [redacted]w[redacted]d by  03/24/2020, by Last Menstrual Period presenting for routine prenatal visit  Plan   pregnancy Problems (from 08/01/19 to present)    Problem Noted Resolved   Chronic hypertension affecting pregnancy 08/13/2019 by Farrel Conners, CNM No   Overview Addendum 11/20/2019 10:35 AM by [name], MD    [x ] Aspirin 81 mg daily after 12 weeks; discontinue after 36 weeks [x ] baseline labs with CBC, CMP, urine protein/creatinine ratio [ ]  no BP meds unless BPs become elevated Labetalol started, 100 BID as of 12/1 [ ]  ultrasound for growth at 28, 32, 36 weeks [ ]  Baseline EKG   Current antihypertensives:  Labetalol 200 mgm BID (Losartan and HCTZ were discontinued)  Baseline and surveillance labs (pulled in from [facility], refresh links as needed)  Lab Results  Component Value Date   PLT 388 08/01/2019   CREATININE 0.56 (L) 08/07/2019   AST 16 08/07/2019   ALT 10 08/07/2019    Antenatal Testing CHTN - O10.919  Group I  BP < 140/90, no preeclampsia, AGA,  nml AFV, +/- meds    Group II BP > 140/90, on meds, no preeclampsia, AGA, nml AFV  20-28-34-38  20-24-28-32-35-38  32//2 x wk  28//BPP wkly then 32//2 x wk  40 no meds; 39 meds  PRN or 37  Pre-eclampsia  GHTN - O13.9/Preeclampsia  without severe features  - O14.00   Preeclampsia with severe features - O14.10  Q 3-4wks  Q 2 wks  28//BPP wkly then 32//2 x wk  Inpatient  37  PRN or 34   UPDATED PLAN: (1/27)  MonthlyUSgrowth surveillance beginning at 28 weeksfollowed by weekly antenatal testing beginning at 32 weeks and twice weekly testing beginning at 34 weekswith delivery at 37-39 weeks depending on maternal and fetal status.      Previous cesarean delivery affecting pregnancy, antepartum 08/13/2019 by Farrel Conners, CNM No   Overview Addendum 11/20/2019 10:34 AM by [name], MD    Hx of 2 Cesarean sections Plan CS 39 weeks Desires BTL as well      Pre-existing type 2 diabetes mellitus during pregnancy, antepartum 08/13/2019 by Farrel Conners, CNM No   Overview Addendum 11/16/2019 10:23 AM by Farrel Conners, CNM    Current Diabetic Medications:  Lantus Insulin,  Metformin    Long-acting   * Lantus 24 units    * Metformin [x ] Aspirin 81 mg daily after 12 weeks; discontinue after 36 weeks (? A2/B GDM)  Required Referrals for A1GDM or A2GDM: [ ]  Diabetes Education and Testing Supplies [ ]  Nutrition Cousult  For A2/B GDM or higher classes of DM [x ] Diabetes Education and Testing Supplies [x ] Nutrition Counsult [x ] Fetal ECHO after 22-24 weeks (fetal echo normal on 1/21)  [x ] Eye exam for retina evaluation  [x ] Baseline EKG [ x] fetal growth every 4 weeks starting at 28 weeks [x ] Twice weekly NST starting at [redacted] weeks gestation [x ] Delivery planning contingent on fetal growth, AFI, glycemic control, and other co-morbidities but at least by 39 weeks  Baseline and surveillance labs (pulled in from [facility], refresh links as needed)  Lab Results  Component Value Date   CREATININE 0.56 (L) 08/07/2019   AST 16 08/07/2019   ALT 10 08/07/2019   TSH 0.841 09/26/2018   Lab Results  Component Value Date   HGBA1C 7.7 (H) 08/07/2019   HGBA1C 7.7 (H) 05/29/2019   HGBA1C 8.8 (H) 04/10/2019    Antenatal Testing Class of DM U/S NST/AFI DELIVERY  Diabetes   A1 - good control - O24.410    A2 - good control - O24.419      A2  - poor control or poor compliance  - O24.419, E11.65   (Macrosomia or polyhydramnios) **E11.65 is extra code for poor control**    A2/B - O24.919  and B-C O24.319  Poor control B-C or D-R-F-T - O24.319  or  Type I DM - O24.019  20-38  20-38  20-24-28-32-36   20-24-28-32-35-38//fetal echo  20-24-27-30-33-36-38//fetal echo  40  32//2 x wk  32//2 x wk   32//2 x wk  28//BPP wkly then 32//2 x wk  40  39  PRN   39  PRN   Has Bionime glucometer 10/14 hemoglobin A1c was 7.7%      Supervision of high risk pregnancy, antepartum 08/01/2019 by 11/14, CNM No   Overview Addendum 01/13/2020  4:42 PM by [name], MD    Clinic [facility] Prenatal Labs  Dating 01/15/2020 Blood type: O/Positive/-- (10/08 1549) O POSITIVE  Genetic Screen declines Antibody:Negative (10/08 1549)negative  Anatomic 12-25-1986 DP MFM Rubella: 3.99 (10/08 1549) Immune Varicella: Immue  GTT Type 2 DM RPR: Non Reactive (10/08 1549) negative  Rhogam n/a HBsAg: Negative (10/08 1549) negative  Vaccines TDAP:  Flu Shot: HIV: Non Reactive (10/08 1549) non reactive  Baby Food Formula                              GBS:   Contraception BTL, consent 30 wks [x]  01/13/2020 Pap: at [facility] June 2020, neg per pt report  CBB  No Hemoglobin A1C 7.7% on 10/14  CS/VBAC 2006 primary for FTP 8#, 2009 repeat 9#10oz Repeat scheduled for 03/19/2020   Support Person Partner Sonia Side- 6'9" tall            Insulin: 8 units with breakfast 6 units with lunch 8 units with dinner 26 units at bedtime   Preterm labor symptoms and general obstetric precautions including but not limited to vaginal bleeding, contractions, leaking of fluid and fetal movement were reviewed in detail with the patient.  MFM growth scan next Thursday Begin weekly APT in 2 weeks  Begin twice weekly APT at 34 weeks  Return in about 1 week (around 01/27/2020) for ROB.  Rod Can, CNM 01/20/2020 9:16 AM

## 2020-01-28 ENCOUNTER — Encounter: Payer: Self-pay | Admitting: Advanced Practice Midwife

## 2020-01-28 ENCOUNTER — Ambulatory Visit (INDEPENDENT_AMBULATORY_CARE_PROVIDER_SITE_OTHER): Payer: Medicaid Other | Admitting: Advanced Practice Midwife

## 2020-01-28 ENCOUNTER — Other Ambulatory Visit: Payer: Self-pay

## 2020-01-28 VITALS — BP 118/74 | Wt 157.0 lb

## 2020-01-28 DIAGNOSIS — O10919 Unspecified pre-existing hypertension complicating pregnancy, unspecified trimester: Secondary | ICD-10-CM

## 2020-01-28 DIAGNOSIS — O24113 Pre-existing diabetes mellitus, type 2, in pregnancy, third trimester: Secondary | ICD-10-CM | POA: Diagnosis not present

## 2020-01-28 DIAGNOSIS — O24119 Pre-existing diabetes mellitus, type 2, in pregnancy, unspecified trimester: Secondary | ICD-10-CM

## 2020-01-28 DIAGNOSIS — Z3A32 32 weeks gestation of pregnancy: Secondary | ICD-10-CM

## 2020-01-28 DIAGNOSIS — O10913 Unspecified pre-existing hypertension complicating pregnancy, third trimester: Secondary | ICD-10-CM | POA: Diagnosis not present

## 2020-01-28 DIAGNOSIS — O0990 Supervision of high risk pregnancy, unspecified, unspecified trimester: Secondary | ICD-10-CM

## 2020-01-28 LAB — FETAL NONSTRESS TEST

## 2020-01-28 MED ORDER — INSULIN PEN NEEDLE 32G X 4 MM MISC: 2 refills | Status: DC

## 2020-01-28 NOTE — Progress Notes (Signed)
No vb. No lof.  

## 2020-01-28 NOTE — Progress Notes (Signed)
Routine Prenatal Care Visit  Subjective  Deanna Zimmerman is a 34 y.o. G3P2002 at [redacted]w[redacted]d being seen today for ongoing prenatal care.  She is currently monitored for the following issues for this high-risk pregnancy and has Diabetes mellitus, new onset (HCC); Hypertension; Goiter; Type 2 diabetes mellitus with hyperglycemia, without long-term current use of insulin (HCC); Supervision of high risk pregnancy, antepartum; Chronic hypertension affecting pregnancy; Previous cesarean delivery affecting pregnancy, antepartum; and Pre-existing type 2 diabetes mellitus during pregnancy, antepartum on their problem list.  ----------------------------------------------------------------------------------- Patient reports no complaints.   Contractions: Not present. Vag. Bleeding: None.  Movement: Present. Leaking Fluid denies.  ----------------------------------------------------------------------------------- The following portions of the patient's history were reviewed and updated as appropriate: allergies, current medications, past family history, past medical history, past social history, past surgical history and problem list. Problem list updated.  Objective  Blood pressure 118/74, weight 157 lb (71.2 kg), last menstrual period 06/18/2019. Pregravid weight 145 lb (65.8 kg) Total Weight Gain 12 lb (5.443 kg) Urinalysis: Urine Protein    Urine Glucose    Fetal Status: Fetal Heart Rate (bpm): 145   Movement: Present      NST: reactive 20 minute tracing, 145 bpm baseline, moderate variability, +accelerations to 160, -decelerations  General:  Alert, oriented and cooperative. Patient is in no acute distress.  Skin: Skin is warm and dry. No rash noted.   Cardiovascular: Normal heart rate noted  Respiratory: Normal respiratory effort, no problems with respiration noted  Abdomen: Soft, gravid, appropriate for gestational age. Pain/Pressure: Present     Pelvic:  Cervical exam deferred        Extremities:  Normal range of motion.  Edema: None  Mental Status: Normal mood and affect. Normal behavior. Normal judgment and thought content.   Assessment   34 y.o. G3P2002 at [redacted]w[redacted]d by  03/24/2020, by Last Menstrual Period presenting for routine prenatal visit  Plan   pregnancy Problems (from 08/01/19 to present)    Problem Noted Resolved   Chronic hypertension affecting pregnancy 08/13/2019 by Farrel Conners, CNM No   Overview Addendum 11/20/2019 10:35 AM by [name], MD    [x ] Aspirin 81 mg daily after 12 weeks; discontinue after 36 weeks [x ] baseline labs with CBC, CMP, urine protein/creatinine ratio [ ]  no BP meds unless BPs become elevated Labetalol started, 100 BID as of 12/1 [ ]  ultrasound for growth at 28, 32, 36 weeks [ ]  Baseline EKG   Current antihypertensives:  Labetalol 200 mgm BID (Losartan and HCTZ were discontinued)  Baseline and surveillance labs (pulled in from [facility], refresh links as needed)  Lab Results  Component Value Date   PLT 388 08/01/2019   CREATININE 0.56 (L) 08/07/2019   AST 16 08/07/2019   ALT 10 08/07/2019    Antenatal Testing CHTN - O10.919  Group I  BP < 140/90, no preeclampsia, AGA,  nml AFV, +/- meds    Group II BP > 140/90, on meds, no preeclampsia, AGA, nml AFV  20-28-34-38  20-24-28-32-35-38  32//2 x wk  28//BPP wkly then 32//2 x wk  40 no meds; 39 meds  PRN or 37  Pre-eclampsia  GHTN - O13.9/Preeclampsia without severe features  - O14.00   Preeclampsia with severe features - O14.10  Q 3-4wks  Q 2 wks  28//BPP wkly then 32//2 x wk  Inpatient  37  PRN or 34   UPDATED PLAN: (1/27) MonthlyUSgrowth surveillance beginning at 28 weeksfollowed by weekly antenatal testing beginning at 32 weeks and  twice weekly testing beginning at 34 weekswith delivery at 37-39 weeks depending on maternal and fetal status.      Previous cesarean delivery affecting pregnancy, antepartum 08/13/2019 by Farrel Conners, CNM No   Overview  Addendum 11/20/2019 10:34 AM by [name], MD    Hx of 2 Cesarean sections Plan CS 39 weeks Desires BTL as well      Pre-existing type 2 diabetes mellitus during pregnancy, antepartum 08/13/2019 by Farrel Conners, CNM No   Overview Addendum 11/16/2019 10:23 AM by Farrel Conners, CNM    Current Diabetic Medications:  Lantus Insulin,  Metformin    Long-acting   * Lantus 24 units    * Metformin [x ] Aspirin 81 mg daily after 12 weeks; discontinue after 36 weeks (? A2/B GDM)  Required Referrals for A1GDM or A2GDM: [ ]  Diabetes Education and Testing Supplies [ ]  Nutrition Cousult  For A2/B GDM or higher classes of DM [x ] Diabetes Education and Testing Supplies [x ] Nutrition Counsult [x ] Fetal ECHO after 22-24 weeks (fetal echo normal on 1/21)  [x ] Eye exam for retina evaluation  [x ] Baseline EKG [ x] fetal growth every 4 weeks starting at 28 weeks [x ] Twice weekly NST starting at [redacted] weeks gestation [x ] Delivery planning contingent on fetal growth, AFI, glycemic control, and other co-morbidities but at least by 39 weeks  Baseline and surveillance labs (pulled in from [facility], refresh links as needed)  Lab Results  Component Value Date   CREATININE 0.56 (L) 08/07/2019   AST 16 08/07/2019   ALT 10 08/07/2019   TSH 0.841 09/26/2018   Lab Results  Component Value Date   HGBA1C 7.7 (H) 08/07/2019   HGBA1C 7.7 (H) 05/29/2019   HGBA1C 8.8 (H) 04/10/2019    Antenatal Testing Class of DM U/S NST/AFI DELIVERY  Diabetes   A1 - good control - O24.410    A2 - good control - O24.419      A2  - poor control or poor compliance - O24.419, E11.65   (Macrosomia or polyhydramnios) **E11.65 is extra code for poor control**    A2/B - O24.919  and B-C O24.319  Poor control B-C or D-R-F-T - O24.319  or  Type I DM - O24.019  20-38  20-38  20-24-28-32-36   20-24-28-32-35-38//fetal echo  20-24-27-30-33-36-38//fetal echo  40  32//2 x wk  32//2 x wk   32//2 x  wk  28//BPP wkly then 32//2 x wk  40  39  PRN   39  PRN   Has Bionime glucometer 10/14 hemoglobin A1c was 7.7%      Supervision of high risk pregnancy, antepartum 08/01/2019 by 11/14, CNM No   Overview Addendum 01/13/2020  4:42 PM by [name], MD    Clinic [facility] Prenatal Labs  Dating 01/15/2020 Blood type: O/Positive/-- (10/08 1549) O POSITIVE  Genetic Screen declines Antibody:Negative (10/08 1549)negative  Anatomic [birth year]1986 DP MFM Rubella: 3.99 (10/08 1549) Immune Varicella: Immue  GTT Type 2 DM RPR: Non Reactive (10/08 1549) negative  Rhogam n/a HBsAg: Negative (10/08 1549) negative  Vaccines TDAP:                       Flu Shot: HIV: Non Reactive (10/08 1549) non reactive  Baby Food Formula  GBS:   Contraception BTL, consent 30 wks [x]  01/13/2020 Pap: at [facility] June 2020, neg per pt report  CBB  No Hemoglobin A1C 7.7% on 10/14  CS/VBAC 2006 primary for FTP 8#, 2009 repeat 9#10oz Repeat scheduled for 03/19/2020   Support Person Partner Sonia Side- 6'9" tall              Preterm labor symptoms and general obstetric precautions including but not limited to vaginal bleeding, contractions, leaking of fluid and fetal movement were reviewed in detail with the patient.   Return in about 1 week (around 02/04/2020) for AFI/NST/ROB.  Rod Can, CNM 01/28/2020 10:24 AM

## 2020-01-30 ENCOUNTER — Ambulatory Visit (HOSPITAL_BASED_OUTPATIENT_CLINIC_OR_DEPARTMENT_OTHER)
Admission: RE | Admit: 2020-01-30 | Discharge: 2020-01-30 | Disposition: A | Discharge status: Home / Self Care | Payer: Medicaid Other | Source: Ambulatory Visit | Attending: Obstetrics and Gynecology | Admitting: Obstetrics and Gynecology

## 2020-01-30 ENCOUNTER — Other Ambulatory Visit: Payer: Self-pay

## 2020-01-30 ENCOUNTER — Ambulatory Visit
Admission: RE | Admit: 2020-01-30 | Discharge: 2020-01-30 | Disposition: A | Discharge status: Home / Self Care | Payer: Medicaid Other | Source: Ambulatory Visit | Attending: Obstetrics and Gynecology | Admitting: Obstetrics and Gynecology

## 2020-01-30 DIAGNOSIS — O24119 Pre-existing diabetes mellitus, type 2, in pregnancy, unspecified trimester: Secondary | ICD-10-CM

## 2020-01-30 DIAGNOSIS — O34219 Maternal care for unspecified type scar from previous cesarean delivery: Secondary | ICD-10-CM | POA: Diagnosis not present

## 2020-01-30 DIAGNOSIS — I10 Essential (primary) hypertension: Secondary | ICD-10-CM | POA: Diagnosis not present

## 2020-01-30 DIAGNOSIS — Z3A32 32 weeks gestation of pregnancy: Secondary | ICD-10-CM | POA: Insufficient documentation

## 2020-01-30 DIAGNOSIS — O24419 Gestational diabetes mellitus in pregnancy, unspecified control: Secondary | ICD-10-CM | POA: Diagnosis present

## 2020-01-30 DIAGNOSIS — E119 Type 2 diabetes mellitus without complications: Secondary | ICD-10-CM

## 2020-01-30 DIAGNOSIS — Z79899 Other long term (current) drug therapy: Secondary | ICD-10-CM | POA: Diagnosis not present

## 2020-01-30 DIAGNOSIS — O163 Unspecified maternal hypertension, third trimester: Secondary | ICD-10-CM | POA: Diagnosis not present

## 2020-01-30 DIAGNOSIS — O10919 Unspecified pre-existing hypertension complicating pregnancy, unspecified trimester: Secondary | ICD-10-CM

## 2020-01-30 DIAGNOSIS — O0990 Supervision of high risk pregnancy, unspecified, unspecified trimester: Secondary | ICD-10-CM

## 2020-01-30 LAB — GLUCOSE, CAPILLARY: Glucose-Capillary: 88 mg/dL (ref 70–99)

## 2020-01-30 NOTE — Progress Notes (Signed)
[facility] Consultation   Chief Complaint: T2 DM o insulin and metformin in pregnancy   HPI: [name] is a 34 y.o. G3P2002 at [redacted]w[redacted]d  who presents in consultation from [facility]   for T2DM on insulin in pregnancy who works at [employer] who presentsfor follow-upconsultation for glycemic review due to T2 DM. She is currently taking Lantus 26u at HS,8u of Humalog with breakfast and 8 U of Humalog with lunch and humlog 8 u  dinner as well asmetformin 1000mg  bid.Her pregnancy is also complicated by hypertension.She is on labetalol 100mg  bid for cHTN.   LAst A1c 5.5 - down from 7.7 !!  Blood glucose log Fastings75-101with 3/13 > 95 2 h post breakfast:71-158 with 6/12>120 2h post lunch:65-144with 7/12>120-                  2h post dinner:105--146With4/11 >120  ( some checked at 1 hour )  Discussed with pt she would like to stay at the same insulin for now since some at the low end and go up if trend upwards   Past Medical History: Patient  has a past medical history of Diabetes mellitus ([place]), Diabetes mellitus without complication ([place]), Goiter (02/07/2017), and Hypertension.  Past Surgical History: She  has a past surgical history that includes Cesarean section and Fracture surgery.  Obstetric History:  OB History    Gravida  3   Para  2   Term  2   Preterm      AB      Living  2     SAB      TAB      Ectopic      Multiple      Live Births  2          Gynecologic History:  Patient's last menstrual period was 06/18/2019 (exact date).     Medications:  Current Outpatient Medications:  .  [employer] Lancets MISC, 100, Disp: 100 each, Rfl: 12 .  glucose blood ([employer]S GLUCOSE METER TEST STRIPS) test strip, Use as instructed, Disp: 100 each, Rfl: 12 .  Insulin Glargine (LANTUS SOLOSTAR) 100 UNIT/ML Solostar Pen, Inject 26 Units into the skin at bedtime., Disp: , Rfl:  .  insulin lispro (HUMALOG) 100 UNIT/ML  injection, 4 units before supper, Disp: 10 mL, Rfl: 3 .  Insulin Pen Needle 32G X 4 MM MISC, Use with insulin pen, Disp: 50 each, Rfl: 2 .  Insulin Syringe-Needle U-100 (BD VEO INSULIN SYRINGE U/F) 31G X 15/64" 0.5 ML MISC, Inject 6 Units into the skin 3 (three) times daily., Disp: , Rfl:  .  labetalol (NORMODYNE) 100 MG tablet, Take 1 tablet (100 mg total) by mouth 2 (two) times daily., Disp: 60 tablet, Rfl: 3 .  metFORMIN (GLUCOPHAGE) 1000 MG tablet, Take 1 tablet (1,000 mg total) by mouth 2 (two) times daily with a meal., Disp: 60 tablet, Rfl: 2 .  metFORMIN (GLUCOPHAGE) 1000 MG tablet, Take 1,000 mg by mouth 2 (two) times daily with a meal., Disp: , Rfl:  .  Prenatal Vit-Fe Fumarate-FA (PRENATAL VITAMIN PO), Take by mouth., Disp: , Rfl:  Allergies: Patient has No Known Allergies.  Social History: Patient  reports that she has never smoked. She has never used smokeless tobacco. She reports that she does not drink alcohol or use drugs.  Family History: family history includes Diabetes in her paternal aunt and sister.  Review of Systems A full 12 point review of systems was negative or  as noted in the History of Present Illness.  Physical Exam: LMP 06/18/2019 (Exact Date)   Weight 157.5 Temp 97.2 122/74 O2 sat 100%  Hr 99   Asessement: 1. Supervision of high risk pregnancy, antepartum   2. Previous cesarean delivery affecting pregnancy, antepartum   3. Pre-existing type 2 diabetes mellitus during pregnancy, antepartum   4. Essential hypertension   5. Diabetes mellitus, new onset (HCC)   6. Chronic hypertension affecting pregnancy     Plan: Type 2 DM: -Hold  Humalog to 8 unitswith each meal and Lantus to 26U qhs. continue metformin  - appreciate adjustments at [facility]  - f/up consult/review of BS and BPs with BPP in one week  - Fetal echo- [facility] -  normal      - last hgb A1c 3 weeks ago 5.5 !   -MonthlyUSgrowth surveillance begin weekly antenatal testing now and twice weekly testing beginning at 34 weekswith delivery at 37 weeks given IDDM and HTN on meds .pt has her cesarean scheduled 5/11  CHTN  - continue labetalol and aspirin -She was previously prescribed astandard (arm) BP cuffand will continue taking her BPs. She has been given guidelines for when to call with abnormal BP or symptoms of preeclampsia.  -Fetalsurveillance as outlinedabove.     Prior Cesarean - planning repeat   Total time spent with the patient was 30 minutes with greater than 50% spent in counseling and coordination of care. We appreciate this interesting consult and will be happy to be involved in the ongoing care of [name] in anyway her obstetricians desire.  Jimmey Ralph  Maternal-Fetal Medicine [facility]

## 2020-02-03 ENCOUNTER — Other Ambulatory Visit: Payer: Self-pay | Admitting: Obstetrics and Gynecology

## 2020-02-03 DIAGNOSIS — Z8639 Personal history of other endocrine, nutritional and metabolic disease: Secondary | ICD-10-CM

## 2020-02-03 DIAGNOSIS — O169 Unspecified maternal hypertension, unspecified trimester: Secondary | ICD-10-CM

## 2020-02-05 ENCOUNTER — Ambulatory Visit (INDEPENDENT_AMBULATORY_CARE_PROVIDER_SITE_OTHER): Payer: Medicaid Other

## 2020-02-05 ENCOUNTER — Ambulatory Visit (INDEPENDENT_AMBULATORY_CARE_PROVIDER_SITE_OTHER): Payer: Medicaid Other | Admitting: Certified Nurse Midwife

## 2020-02-05 ENCOUNTER — Other Ambulatory Visit: Payer: Self-pay

## 2020-02-05 VITALS — BP 122/74 | Wt 158.0 lb

## 2020-02-05 DIAGNOSIS — O0993 Supervision of high risk pregnancy, unspecified, third trimester: Secondary | ICD-10-CM | POA: Diagnosis not present

## 2020-02-05 DIAGNOSIS — O34219 Maternal care for unspecified type scar from previous cesarean delivery: Secondary | ICD-10-CM

## 2020-02-05 DIAGNOSIS — O10913 Unspecified pre-existing hypertension complicating pregnancy, third trimester: Secondary | ICD-10-CM

## 2020-02-05 DIAGNOSIS — Z3A33 33 weeks gestation of pregnancy: Secondary | ICD-10-CM

## 2020-02-05 DIAGNOSIS — O24119 Pre-existing diabetes mellitus, type 2, in pregnancy, unspecified trimester: Secondary | ICD-10-CM

## 2020-02-05 DIAGNOSIS — O10919 Unspecified pre-existing hypertension complicating pregnancy, unspecified trimester: Secondary | ICD-10-CM

## 2020-02-05 DIAGNOSIS — O24113 Pre-existing diabetes mellitus, type 2, in pregnancy, third trimester: Secondary | ICD-10-CM | POA: Diagnosis not present

## 2020-02-05 DIAGNOSIS — O0990 Supervision of high risk pregnancy, unspecified, unspecified trimester: Secondary | ICD-10-CM

## 2020-02-05 NOTE — Telephone Encounter (Signed)
Per Dr Ellie Lunch request C/S has been changed to 5/13  H&P 4/28 @ 8:50am  Covid 5/11 @ 9-10am  Waiting to hear from pre-admit for new appt.  Spoke to pt f45f in office and went over changes and gave written appt updates. Also conf that pt uses MyChart and adv that updates will reflect there also

## 2020-02-05 NOTE — Telephone Encounter (Signed)
Please move cesarean section to May 13th.

## 2020-02-05 NOTE — Progress Notes (Signed)
AFI/NST today. No vb. No lof.

## 2020-02-06 ENCOUNTER — Ambulatory Visit
Admission: RE | Admit: 2020-02-06 | Discharge: 2020-02-06 | Disposition: A | Discharge status: Home / Self Care | Payer: Medicaid Other | Source: Ambulatory Visit | Attending: Obstetrics and Gynecology | Admitting: Obstetrics and Gynecology

## 2020-02-06 ENCOUNTER — Other Ambulatory Visit: Payer: Self-pay | Admitting: Obstetrics and Gynecology

## 2020-02-06 ENCOUNTER — Other Ambulatory Visit: Payer: Self-pay

## 2020-02-06 DIAGNOSIS — Z794 Long term (current) use of insulin: Secondary | ICD-10-CM | POA: Insufficient documentation

## 2020-02-06 DIAGNOSIS — O10919 Unspecified pre-existing hypertension complicating pregnancy, unspecified trimester: Secondary | ICD-10-CM

## 2020-02-06 DIAGNOSIS — O10013 Pre-existing essential hypertension complicating pregnancy, third trimester: Secondary | ICD-10-CM | POA: Insufficient documentation

## 2020-02-06 DIAGNOSIS — Z7901 Long term (current) use of anticoagulants: Secondary | ICD-10-CM | POA: Diagnosis not present

## 2020-02-06 DIAGNOSIS — O24919 Unspecified diabetes mellitus in pregnancy, unspecified trimester: Secondary | ICD-10-CM

## 2020-02-06 DIAGNOSIS — O0990 Supervision of high risk pregnancy, unspecified, unspecified trimester: Secondary | ICD-10-CM

## 2020-02-06 DIAGNOSIS — Z3A33 33 weeks gestation of pregnancy: Secondary | ICD-10-CM | POA: Diagnosis not present

## 2020-02-06 DIAGNOSIS — Z309 Encounter for contraceptive management, unspecified: Secondary | ICD-10-CM | POA: Insufficient documentation

## 2020-02-06 DIAGNOSIS — O34219 Maternal care for unspecified type scar from previous cesarean delivery: Secondary | ICD-10-CM

## 2020-02-06 DIAGNOSIS — Z8639 Personal history of other endocrine, nutritional and metabolic disease: Secondary | ICD-10-CM

## 2020-02-06 DIAGNOSIS — O24113 Pre-existing diabetes mellitus, type 2, in pregnancy, third trimester: Secondary | ICD-10-CM | POA: Diagnosis not present

## 2020-02-06 DIAGNOSIS — E119 Type 2 diabetes mellitus without complications: Secondary | ICD-10-CM | POA: Diagnosis not present

## 2020-02-06 DIAGNOSIS — O169 Unspecified maternal hypertension, unspecified trimester: Secondary | ICD-10-CM

## 2020-02-06 DIAGNOSIS — Z3009 Encounter for other general counseling and advice on contraception: Secondary | ICD-10-CM

## 2020-02-06 DIAGNOSIS — O24119 Pre-existing diabetes mellitus, type 2, in pregnancy, unspecified trimester: Secondary | ICD-10-CM

## 2020-02-06 DIAGNOSIS — I10 Essential (primary) hypertension: Secondary | ICD-10-CM

## 2020-02-06 LAB — GLUCOSE, CAPILLARY: Glucose-Capillary: 162 mg/dL — ABNORMAL HIGH (ref 70–99)

## 2020-02-06 NOTE — Progress Notes (Signed)
Pt didn't bring sugars today Reports they are doing well  Elevated today due to eating a bagel   Today's Vitals   02/06/20 0813  BP: 107/72  Pulse: (!) 106  Temp: 98.1 F (36.7 C)  Weight: 71.7 kg   Body mass index is 30.86 kg/m.  RBS 160 BPP 8/8   A/ IUp at 33 w  Type 2 DM on insulin and metformin  Hypertension on labetalol   Plan  Start twice weekly monitoring next week once here once at [facility] and BTL ( has signed papers )  I suggested she discuss timing of delivery with her providers at [facility], Lanora Manis, MD

## 2020-02-09 NOTE — Progress Notes (Signed)
HROB / NST/ AFI: CHTN on labetalol 100 mgm BID and T1DM on 26U Lantus daily, and 8U with each meal. Reports good FM. Has appointment tomorrow at [facility] for review of blood sugars  BP 122/74 AFI 15.6 cm/ cephalic presentation  NST reactive with baseline 145 and accelerations to 160s, moderate variability  A: IUP at 33wk1d with reactive NST and normal AFI CHTN: On labetalol 100 mgm BID: Normotensive T1DM Prior CS x 2-will have repeat CS   P: Start twice weekly NSTs next week Rescheduled repeat CS to 37 weeks per DP recommendation: Dr Jerene Pitch moving CS and BTL  to 03/05/2020  30 day Medicaid papers signed 3/22 Continue current insulin regime Bottle feeding  [address] [place], [place]

## 2020-02-10 ENCOUNTER — Other Ambulatory Visit: Payer: Self-pay

## 2020-02-10 ENCOUNTER — Ambulatory Visit
Admission: RE | Admit: 2020-02-10 | Discharge: 2020-02-10 | Disposition: A | Discharge status: Home / Self Care | Payer: Medicaid Other | Source: Ambulatory Visit | Attending: Obstetrics and Gynecology | Admitting: Obstetrics and Gynecology

## 2020-02-10 DIAGNOSIS — Z3A33 33 weeks gestation of pregnancy: Secondary | ICD-10-CM | POA: Diagnosis not present

## 2020-02-10 DIAGNOSIS — Z794 Long term (current) use of insulin: Secondary | ICD-10-CM | POA: Diagnosis not present

## 2020-02-10 DIAGNOSIS — O24913 Unspecified diabetes mellitus in pregnancy, third trimester: Secondary | ICD-10-CM | POA: Diagnosis not present

## 2020-02-10 DIAGNOSIS — O34219 Maternal care for unspecified type scar from previous cesarean delivery: Secondary | ICD-10-CM | POA: Diagnosis not present

## 2020-02-10 DIAGNOSIS — O0990 Supervision of high risk pregnancy, unspecified, unspecified trimester: Secondary | ICD-10-CM

## 2020-02-10 DIAGNOSIS — O24919 Unspecified diabetes mellitus in pregnancy, unspecified trimester: Secondary | ICD-10-CM

## 2020-02-10 DIAGNOSIS — O10919 Unspecified pre-existing hypertension complicating pregnancy, unspecified trimester: Secondary | ICD-10-CM

## 2020-02-10 DIAGNOSIS — O10913 Unspecified pre-existing hypertension complicating pregnancy, third trimester: Secondary | ICD-10-CM | POA: Diagnosis not present

## 2020-02-10 DIAGNOSIS — O24119 Pre-existing diabetes mellitus, type 2, in pregnancy, unspecified trimester: Secondary | ICD-10-CM

## 2020-02-10 DIAGNOSIS — O10019 Pre-existing essential hypertension complicating pregnancy, unspecified trimester: Secondary | ICD-10-CM

## 2020-02-10 DIAGNOSIS — I10 Essential (primary) hypertension: Secondary | ICD-10-CM

## 2020-02-10 NOTE — Consult Note (Signed)
[facility]etal [facility] Consultation   Chief Complaint: Chronic HTN and Type 2 diabetes in pregnancy (insulin and metformin therapy)  HPI: [name] is a 34 y.o. G3P2002 at [redacted]w[redacted]d who presents for follow-up consultation for review of her blood sugars.  She is currently taking Lantus 26U qhs and Humalog 8/8/8U with meals in addition to 1000mg  metformin BID.  Blood glucose log: Fasting  80-95 (4/12 >90) 2hr post breakfast 81-153 (9/11>120) 2hr post lunch 73-134 (4/11>120) 2hr post dinner 100-143 (6/10 >120)  Random BS 119  Vital signs: 155.5# 98.1 102/79 108 18 100% O2  Assessment: 1.  Previous c-section 2.  Type 2 diabetes antepartum 3.  Chronic HTN  Plan:  1.  Previous c-section  - scheduled repeat c-section and BTL 5/13  2.  Type 2 diabetes  - keep Lantus at 26U  - Humalog 10/8/10U with meals  - continue Metformin 1000mg  BID  - continue 2x weekly antenatal testing (once at [facility] and once at [facility])  - delivery on 5/13 (37 weeks)  - may review BS with next BPP  3.  Chronic HTN  - well controlled  - has a home BP cuff and is aware of guidelines for when to call with abnormal BP or symptoms of preeclsampsia  Total time spent with the patient was16minutes with greater than 50% spent in counseling and coordination of care.   6/13, MD Maternal-Fetal Medicine [facility]

## 2020-02-12 ENCOUNTER — Other Ambulatory Visit: Payer: Self-pay | Admitting: Maternal & Fetal Medicine

## 2020-02-12 DIAGNOSIS — O24112 Pre-existing diabetes mellitus, type 2, in pregnancy, second trimester: Secondary | ICD-10-CM

## 2020-02-13 ENCOUNTER — Ambulatory Visit (INDEPENDENT_AMBULATORY_CARE_PROVIDER_SITE_OTHER): Payer: Medicaid Other

## 2020-02-13 ENCOUNTER — Encounter: Payer: Self-pay | Admitting: Obstetrics & Gynecology

## 2020-02-13 ENCOUNTER — Other Ambulatory Visit: Payer: Self-pay

## 2020-02-13 ENCOUNTER — Ambulatory Visit (INDEPENDENT_AMBULATORY_CARE_PROVIDER_SITE_OTHER): Payer: Medicaid Other | Admitting: Obstetrics & Gynecology

## 2020-02-13 ENCOUNTER — Other Ambulatory Visit: Payer: Medicaid Other

## 2020-02-13 VITALS — BP 102/70 | Wt 159.0 lb

## 2020-02-13 DIAGNOSIS — O10919 Unspecified pre-existing hypertension complicating pregnancy, unspecified trimester: Secondary | ICD-10-CM

## 2020-02-13 DIAGNOSIS — Z3A34 34 weeks gestation of pregnancy: Secondary | ICD-10-CM

## 2020-02-13 DIAGNOSIS — O24113 Pre-existing diabetes mellitus, type 2, in pregnancy, third trimester: Secondary | ICD-10-CM | POA: Diagnosis not present

## 2020-02-13 DIAGNOSIS — O0990 Supervision of high risk pregnancy, unspecified, unspecified trimester: Secondary | ICD-10-CM

## 2020-02-13 DIAGNOSIS — O0993 Supervision of high risk pregnancy, unspecified, third trimester: Secondary | ICD-10-CM | POA: Diagnosis not present

## 2020-02-13 DIAGNOSIS — O24119 Pre-existing diabetes mellitus, type 2, in pregnancy, unspecified trimester: Secondary | ICD-10-CM

## 2020-02-13 DIAGNOSIS — O34219 Maternal care for unspecified type scar from previous cesarean delivery: Secondary | ICD-10-CM

## 2020-02-13 DIAGNOSIS — O10913 Unspecified pre-existing hypertension complicating pregnancy, third trimester: Secondary | ICD-10-CM

## 2020-02-13 LAB — GLUCOSE, CAPILLARY: Glucose-Capillary: 119 mg/dL — ABNORMAL HIGH (ref 70–99)

## 2020-02-13 LAB — FETAL NONSTRESS TEST

## 2020-02-13 NOTE — Progress Notes (Signed)
  Subjective  Fetal Movement? yes Contractions? no Leaking Fluid? no Vaginal Bleeding? no CHTN on labetalol 100 mgm BID and T1DM on 26U Lantus daily, and 10U/8U/10U with each meal.    BS sometimes >120 after dinner, recent change to 10U at this meal Denies ha, blurry vision, CP, SOB, edema, epigastric pain Objective  BP 102/70   Wt 159 lb (72.1 kg)   LMP 06/18/2019 (Exact Date)   BMI 31.05 kg/m  General: NAD Pumonary: no increased work of breathing Abdomen: gravid, non-tender Extremities: no edema Psychiatric: mood appropriate, affect full  A NST procedure was performed with FHR monitoring and a normal baseline established, appropriate time of 20-40 minutes of evaluation, and accels >2 seen w 15x15 characteristics.  Results show a REACTIVE NST.   Assessment  34 y.o. K4M0102 at [redacted]w[redacted]d by  03/24/2020, by Last Menstrual Period presenting for routine prenatal visit  Plan   Problem List Items Addressed This Visit    Chronic hypertension affecting pregnancy    Cont Labetalol    Monitor for s/sx preeclampsia     Pre-existing type 2 diabetes mellitus during pregnancy, antepartum     Cont current Insulin regimen    NST twice weekly, [place] weekly    Review of ULTRASOUND.    I have personally reviewed images and report of recent ultrasound done at [facility].    Plan of management to be discussed with patient.  Good growth, AFI nml, Vtx    Previous cesarean delivery affecting pregnancy, antepartum    Plans CS BTL at 37 weeks    [redacted] weeks gestation of pregnancy        PNV, FMC discussed    PTL precautions    Supervision of high risk pregnancy in third trimester           Clinic [facility] Prenatal Labs  Dating [place] Blood type: O/Positive/-- (10/08 1549) O POSITIVE  Genetic Screen declines Antibody:Negative (10/08 1549)negative  Anatomic [place] DP MFM Rubella: 3.99 (10/08 1549) Immune Varicella: Immue  GTT Type 2 DM RPR: Non Reactive (10/08 1549) negative  Rhogam n/a HBsAg: Negative (10/08  1549) negative  Vaccines TDAP:  01/20/20                     Flu Shot: HIV: Non Reactive (10/08 1549) non reactive  Baby Food Formula                              GBS: p  Contraception BTL, consent 30 wks [x]  01/13/2020 Pap: at [facility] June 2020, neg per pt report  CBB  No Hemoglobin A1C 7.7% on 10/14  CS/VBAC 2006 primary for FTP 8#, 2009 repeat 9#10oz Repeat scheduled for 03/05/2020   Support Person Partner Hamel- 6'9" tall              [place], MD, [name] Ob/Gyn, [facility] 02/13/2020  9:07 AM

## 2020-02-13 NOTE — Patient Instructions (Signed)
Nonstress Test A nonstress test is a procedure that is done during pregnancy in order to check the baby's heartbeat. The procedure can help show if the baby (fetus) is healthy. It is commonly done when:  The baby is past his or her due date.  The pregnancy is high risk.  The baby is moving less than normal.  The mother has lost a pregnancy in the past.  The health care provider suspects a problem with the baby's growth.  There is too much or too little amniotic fluid. The procedure is often done in the third trimester of pregnancy to find out if an early delivery is needed and whether such a delivery is safe. During a nonstress test, the baby's heartbeat is monitored when the baby is resting and when the baby is moving. If the baby is healthy, the heart rate will increase when he or she moves or kicks and will return to normal when he or she rests. Tell a health care provider about:  Any allergies you have.  Any medical conditions you have.  All medicines you are taking, including vitamins, herbs, eye drops, creams, and over-the-counter medicines. What are the risks? There are no risks to you or your baby from a nonstress test. This procedure should not be painful or uncomfortable. What happens before the procedure?  Eat a meal right before the test or as directed by your health care provider. Food may help encourage the baby to move.  Use the restroom right before the test. What happens during the procedure?  Two monitors will be placed on your abdomen. One will record the baby's heart rate and the other will record the contractions of your uterus.  You may be asked to lie down on your side or to sit upright.  You may be given a button to press when you feel your baby move.  Your health care provider will listen to your baby's heartbeat and recorded it. He or she may also watch your baby's heartbeat on a screen.  If the baby seems to be sleeping, you may be asked to drink  some juice or soda, eat a snack, or change positions. The procedure may vary among health care providers and hospitals. What happens after the procedure?  Your health care provider will discuss the test results with you and make recommendations for the future. Depending on the results, your health care provider may order additional tests or another course of action.  If your health care provider gave you any diet or activity instructions, make sure to follow them.  Keep all follow-up visits as told by your health care provider. This is important. Summary  A nonstress test is a procedure that is done during pregnancy in order to check the baby's heartbeat. The procedure can help show if the baby is healthy.  The procedure is often done in the third trimester of pregnancy to find out if an early delivery is needed and whether such a delivery is safe.  During a nonstress test, the baby's heartbeat is monitored when the baby is resting and when the baby is moving. If the baby is healthy, the heart rate will increase when he or she moves or kicks and will return to normal when he or she rests.  Your health care provider will discuss the test results with you and make recommendations for the future. This information is not intended to replace advice given to you by your health care provider. Make sure you discuss any   questions you have with your health care provider. Document Revised: 01/19/2017 Document Reviewed: 01/19/2017 Elsevier Patient Education  2020 [employer]  

## 2020-02-17 ENCOUNTER — Other Ambulatory Visit: Payer: Self-pay

## 2020-02-17 ENCOUNTER — Ambulatory Visit
Admission: RE | Admit: 2020-02-17 | Discharge: 2020-02-17 | Disposition: A | Discharge status: Home / Self Care | Payer: Medicaid Other | Source: Ambulatory Visit | Attending: Obstetrics and Gynecology | Admitting: Obstetrics and Gynecology

## 2020-02-17 ENCOUNTER — Ambulatory Visit (INDEPENDENT_AMBULATORY_CARE_PROVIDER_SITE_OTHER): Payer: Medicaid Other | Admitting: Certified Nurse Midwife

## 2020-02-17 VITALS — BP 108/74 | HR 108 | Temp 97.6°F | Wt 159.5 lb

## 2020-02-17 VITALS — BP 102/66 | Wt 161.0 lb

## 2020-02-17 DIAGNOSIS — Z79899 Other long term (current) drug therapy: Secondary | ICD-10-CM | POA: Insufficient documentation

## 2020-02-17 DIAGNOSIS — O0990 Supervision of high risk pregnancy, unspecified, unspecified trimester: Secondary | ICD-10-CM

## 2020-02-17 DIAGNOSIS — E1165 Type 2 diabetes mellitus with hyperglycemia: Secondary | ICD-10-CM

## 2020-02-17 DIAGNOSIS — O24112 Pre-existing diabetes mellitus, type 2, in pregnancy, second trimester: Secondary | ICD-10-CM | POA: Diagnosis not present

## 2020-02-17 DIAGNOSIS — Z833 Family history of diabetes mellitus: Secondary | ICD-10-CM | POA: Insufficient documentation

## 2020-02-17 DIAGNOSIS — O34219 Maternal care for unspecified type scar from previous cesarean delivery: Secondary | ICD-10-CM | POA: Diagnosis not present

## 2020-02-17 DIAGNOSIS — O10919 Unspecified pre-existing hypertension complicating pregnancy, unspecified trimester: Secondary | ICD-10-CM

## 2020-02-17 DIAGNOSIS — Z3A34 34 weeks gestation of pregnancy: Secondary | ICD-10-CM | POA: Insufficient documentation

## 2020-02-17 DIAGNOSIS — O24113 Pre-existing diabetes mellitus, type 2, in pregnancy, third trimester: Secondary | ICD-10-CM | POA: Diagnosis not present

## 2020-02-17 DIAGNOSIS — O24119 Pre-existing diabetes mellitus, type 2, in pregnancy, unspecified trimester: Secondary | ICD-10-CM | POA: Diagnosis not present

## 2020-02-17 DIAGNOSIS — I10 Essential (primary) hypertension: Secondary | ICD-10-CM

## 2020-02-17 DIAGNOSIS — Z3689 Encounter for other specified antenatal screening: Secondary | ICD-10-CM

## 2020-02-17 DIAGNOSIS — O0993 Supervision of high risk pregnancy, unspecified, third trimester: Secondary | ICD-10-CM

## 2020-02-17 DIAGNOSIS — O10913 Unspecified pre-existing hypertension complicating pregnancy, third trimester: Secondary | ICD-10-CM

## 2020-02-17 DIAGNOSIS — E119 Type 2 diabetes mellitus without complications: Secondary | ICD-10-CM

## 2020-02-17 DIAGNOSIS — Z794 Long term (current) use of insulin: Secondary | ICD-10-CM | POA: Diagnosis not present

## 2020-02-17 LAB — POCT URINALYSIS DIPSTICK OB
Glucose, UA: NEGATIVE
POC,PROTEIN,UA: NEGATIVE

## 2020-02-17 LAB — GLUCOSE, CAPILLARY: Glucose-Capillary: 135 mg/dL — ABNORMAL HIGH (ref 70–99)

## 2020-02-17 MED ORDER — INSULIN LISPRO (1 UNIT DIAL) 100 UNIT/ML (KWIKPEN): PEN_INJECTOR | SUBCUTANEOUS | 11 refills | Status: DC

## 2020-02-17 NOTE — Progress Notes (Signed)
NST today. No complaints. 

## 2020-02-17 NOTE — Progress Notes (Signed)
[facility] Consultation   Chief Complaint: review blood sugars and BP   HPI: [name] is a 34 y.o. G3P2002 at [redacted]w[redacted]d who presents in consultation from [facility]   for BPP and blood sugar review   Past Medical History: Patient  has a past medical history of Diabetes mellitus (HCC), Diabetes mellitus without complication (HCC), Goiter (02/07/2017), and Hypertension.  Past Surgical History: She  has a past surgical history that includes Cesarean section and Fracture surgery.  Obstetric History:  OB History    Gravida  3   Para  2   Term  2   Preterm      AB      Living  2     SAB      TAB      Ectopic      Multiple      Live Births  2          Gynecologic History:  Patient's last menstrual period was 06/18/2019 (exact date).   Medications:  Current Outpatient Medications:  .  [employer] Lancets MISC, 100, Disp: 100 each, Rfl: 12 .  glucose blood ([facility] GLUCOSE METER TEST STRIPS) test strip, Use as instructed, Disp: 100 each, Rfl: 12 .  Insulin Glargine (LANTUS SOLOSTAR) 100 UNIT/ML Solostar Pen, Inject 26 Units into the skin at bedtime., Disp: , Rfl:  .  insulin lispro (HUMALOG) 100 UNIT/ML injection, 4 units before supper (Patient taking differently: Inject 8-10 Units into the skin See admin instructions. Inject 10 units subcutaneously in the morning, inject 8 units subcutaneously at lunch & inject 10 units subcutaneously at night.), Disp: 10 mL, Rfl: 3 .  Insulin Pen Needle 32G X 4 MM MISC, Use with insulin pen, Disp: 50 each, Rfl: 2 .  Insulin Syringe-Needle U-100 (BD VEO INSULIN SYRINGE U/F) 31G X 15/64" 0.5 ML MISC, Inject 6 Units into the skin 3 (three) times daily., Disp: , Rfl:  .  labetalol (NORMODYNE) 100 MG tablet, Take 1 tablet (100 mg total) by mouth 2 (two) times daily., Disp: 60 tablet, Rfl: 3 .  metFORMIN (GLUCOPHAGE) 1000 MG tablet, Take 1,000 mg by mouth 2 (two) times daily with a meal., Disp: , Rfl:  .  Prenatal Vit-Fe Fumarate-FA  (PRENATAL VITAMIN PO), Take 1 tablet by mouth daily. , Disp: , Rfl:  Allergies: Patient has No Known Allergies.  Social History: Patient  reports that she has never smoked. She has never used smokeless tobacco. She reports that she does not drink alcohol or use drugs.  Family History: family history includes Diabetes in her paternal aunt and sister.  Review of Systems A full 12 point review of systems was negative or as noted in the History of Present Illness.  Physical Exam: LMP 06/18/2019 (Exact Date)   bpp 8/8  On lantus 26  humalog 8/8/8  REviewed blood sugars  FBS 69-90 all in range 2 h pp breakfast 90-132 1/6 > 120  2 h pp lunch 70-133 1/6 >120 2 h pp supper 114- 140 5/6 > 120   Increased pm humalog to 10  So now lantus 26 u qhs  humalog 05/31/09   Asessement: 1. Type 2 diabetes mellitus with hyperglycemia, without long-term current use of insulin (HCC)   2. Type 2 diabetes mellitus affecting pregnancy in second trimester, antepartum   3. Previous cesarean delivery affecting pregnancy, antepartum   4. Pre-existing type 2 diabetes mellitus during pregnancy, antepartum   5. Chronic hypertension affecting pregnancy   6. Supervision  of high risk pregnancy, antepartum   7. Essential hypertension     Plan: RTC 1 week  Growth then ( last growth on 5/8)  Refill humalog increase to 05/31/09  Continue lantus 26   Pt has delivery plans  C/s BTL at 37 weeks with [facility]   Total time spent with the patient was 30 minutes with greater than 50% spent in counseling and coordination of care. We appreciate this interesting consult and will be happy to be involved in the ongoing care of Deanna Zimmerman in anyway her obstetricians desire. Shaivi Rothschild, [facility]

## 2020-02-19 ENCOUNTER — Other Ambulatory Visit: Payer: Self-pay

## 2020-02-19 ENCOUNTER — Encounter: Payer: Self-pay | Admitting: Obstetrics and Gynecology

## 2020-02-19 ENCOUNTER — Ambulatory Visit (INDEPENDENT_AMBULATORY_CARE_PROVIDER_SITE_OTHER): Payer: Medicaid Other | Admitting: Obstetrics and Gynecology

## 2020-02-19 VITALS — BP 110/68 | Ht 60.0 in | Wt 157.0 lb

## 2020-02-19 DIAGNOSIS — Z302 Encounter for sterilization: Secondary | ICD-10-CM

## 2020-02-19 DIAGNOSIS — O0993 Supervision of high risk pregnancy, unspecified, third trimester: Secondary | ICD-10-CM | POA: Diagnosis not present

## 2020-02-19 DIAGNOSIS — O34219 Maternal care for unspecified type scar from previous cesarean delivery: Secondary | ICD-10-CM

## 2020-02-19 DIAGNOSIS — O24119 Pre-existing diabetes mellitus, type 2, in pregnancy, unspecified trimester: Secondary | ICD-10-CM

## 2020-02-19 DIAGNOSIS — Z3A35 35 weeks gestation of pregnancy: Secondary | ICD-10-CM

## 2020-02-19 DIAGNOSIS — O10919 Unspecified pre-existing hypertension complicating pregnancy, unspecified trimester: Secondary | ICD-10-CM

## 2020-02-19 LAB — POCT URINALYSIS DIPSTICK OB
Glucose, UA: NEGATIVE
POC,PROTEIN,UA: NEGATIVE

## 2020-02-19 NOTE — H&P (View-Only) (Signed)
Deanna Zimmerman is an 34 y.o. female.   Chief Complaint: history of prior cesarean section. Desires repeat cesarean section and SterilizatDiamantina Providenceion HPI:  34 yo G3P2002 5457w2d here fore a preoperative visit. She has no complaints.  pregnancy Problems (from 08/01/19 to present)    Problem Noted Resolved   Chronic hypertension affecting pregnancy 08/13/2019 by Farrel ConnersGutierrez, Colleen, CNM No   Overview Addendum 11/20/2019 10:35 AM by Nadara MustardHarris, Robert P, MD    [x ] Aspirin 81 mg daily after 12 weeks; discontinue after 36 weeks [x ] baseline labs with CBC, CMP, urine protein/creatinine ratio [ ]  no BP meds unless BPs become elevated Labetalol started, 100 BID as of 12/1 [ ]  ultrasound for growth at 28, 32, 36 weeks [ ]  Baseline EKG   Current antihypertensives:  Labetalol 200 mgm BID (Losartan and HCTZ were discontinued)  Baseline and surveillance [facility] refresh links as needed)  Lab Results  Component Value Date   PLT 388 08/01/2019   CREATININE 0.56 (L) 08/07/2019   AST 16 08/07/2019   ALT 10 08/07/2019    Antenatal Testing CHTN - O10.919  Group I  BP < 140/90, no preeclampsia, AGA,  nml AFV, +/- meds    Group II BP > 140/90, on meds, no preeclampsia, AGA, nml AFV  20-28-34-38  20-24-28-32-35-38  32//2 x wk  28//BPP wkly then 32//2 x wk  40 no meds; 39 meds  PRN or 37  Pre-eclampsia  GHTN - O13.9/Preeclampsia without severe features  - O14.00   Preeclampsia with severe features - O14.10  Q 3-4wks  Q 2 wks  28//BPP wkly then 32//2 x wk  Inpatient  37  PRN or 34   UPDATED PLAN: (1/27) MonthlyUSgrowth surveillance beginning at 28 weeksfollowed by weekly antenatal testing beginning at 32 weeks and twice weekly testing beginning at 34 weekswith delivery at 37-39 weeks depending on maternal and fetal status.      Previous cesarean delivery affecting pregnancy, antepartum 08/13/2019 by Farrel ConnersGutierrez, Colleen, CNM No   Overview Addendum 02/06/2020  8:45 AM by  Jimmey RalphLivingston, Elizabeth, MD    Hx of 2 Cesarean sections Plan CS given IDDM and HTN would perform at 37-39 weeks  Desires BTL as well      Pre-existing type 2 diabetes mellitus during pregnancy, antepartum 08/13/2019 by Farrel ConnersGutierrez, Colleen, CNM No   Overview Addendum 01/30/2020  8:28 AM by Jimmey RalphLivingston, Elizabeth, MD    Current Diabetic Medications:  Lantus Insulin,  Metformin    Long-acting   * Lantus 26 units humalog Humalog 8 u tid    * Metformin [x ] Aspirin 81 mg daily after 12 weeks; discontinue after 36 weeks (? A2/B GDM)    For A2/B GDM or higher classes of DM [x ] Diabetes Education and Testing Supplies [x ] Nutrition Counsult [x ] Fetal ECHO after 22-24 weeks (fetal echo normal on 1/21)  [x ] Eye exam for retina evaluation  [x ] Baseline EKG [ x] US fetal growth every 4 weeks starting at 28 weeks [x ] Twice weekly NST starting at [redacted] weeks gestation [x ] Delivery planning contingent on fetal growth, AFI, glycemic control, and other co-morbidities but at least by 39 weeks  Baseline and surveillance labs ([facility], refresh links as needed)  Lab Results  Component Value Date   CREATININE 0.56 (L) 08/07/2019   AST 16 08/07/2019   ALT 10 08/07/2019   TSH 0.841 09/26/2018   Lab Results  Component Value Date  HGBA1C 7.7 (H) 08/07/2019   HGBA1C 7.7 (H) 05/29/2019   HGBA1C 8.8 (H) 04/10/2019    Antenatal Testing Class of DM U/S NST/AFI DELIVERY  Diabetes   A1 - good control - O24.410    A2 - good control - O24.419      A2  - poor control or poor compliance - O24.419, E11.65   (Macrosomia or polyhydramnios) **E11.65 is extra code for poor control**    A2/B - O24.919  and B-C O24.319  Poor control B-C or D-R-F-T - O24.319  or  Type I DM - O24.019  20-38  20-38  20-24-28-32-36   20-24-28-32-35-38//fetal echo  20-24-27-30-33-36-38//fetal echo  40  32//2 x wk  32//2 x wk   32//2 x wk  28//BPP wkly then 32//2 x wk   40  39  PRN   39  PRN   Has Bionime glucometer 10/14 hemoglobin A1c was 7.7%      Supervision of high risk pregnancy, antepartum 08/01/2019 by Rod Can, CNM No   Overview Addendum 02/09/2020  6:35 PM by [name], [facility]renatal Labs  Dating [place] Blood type: O/Positive/-- (10/08 1549) O POSITIVE  Genetic Screen declines Antibody:Negative (10/08 1549)negative  Anatomic [place] DP MFM Rubella: 3.99 (10/08 1549) Immune Varicella: Immue  GTT Type 2 DM RPR: Non Reactive (10/08 1549) negative  Rhogam n/a HBsAg: Negative (10/08 1549) negative  Vaccines TDAP:  01/20/20                     Flu Shot: HIV: Non Reactive (10/08 1549) non reactive  Baby Food Formula                              GBS:   Contraception BTL, consent 30 wks [x]  01/13/2020 Pap: at [facility] June 2020, neg per pt report  CBB  No Hemoglobin A1C 7.7% on 10/14  CS/VBAC 2006 primary for FTP 8#, 2009 repeat 9#10oz Repeat scheduled for 03/05/2020   Support Person Partner Sonia Side- 6'9" tall               Past Medical History:  Diagnosis Date  . Diabetes mellitus ([place])   . Diabetes mellitus without complication ([place])   . Goiter 02/07/2017  . Hypertension     Past Surgical History:  Procedure Laterality Date  . CESAREAN SECTION    . FRACTURE SURGERY      Family History  Problem Relation Age of Onset  . Diabetes Sister   . Diabetes Paternal Aunt    Social History:  reports that she has never smoked. She has never used smokeless tobacco. She reports that she does not drink alcohol or use drugs.  Allergies: No Known Allergies  (Not in a hospital admission)   Results for orders placed or performed in visit on 02/19/20 (from the past 48 hour(s))  POC Urinalysis Dipstick OB     Status: None   Collection Time: 02/19/20  9:02 AM  Result Value Ref Range   Color, UA     Clarity, UA     Glucose, UA Negative Negative   Bilirubin, UA     Ketones, UA     Spec Grav, UA     Blood, UA      pH, UA     POC,PROTEIN,UA Negative Negative, Trace, Small (1+), Moderate (2+), Large (3+), 4+   Urobilinogen, UA     Nitrite, UA  Leukocytes, UA     Appearance     Odor     [place] MFM FETAL BPP WO NON STRESS  Result Date: 02/17/2020 ----------------------------------------------------------------------  OBSTETRICS REPORT                       (Signed Final 02/17/2020 10:40 am) ---------------------------------------------------------------------- PATIENT INFO:  ID #:       [id number]                          D.O.B.:  01-20-1986 (34 yrs)  Name:       [name]              Visit Date: 02/17/2020 10:04 am ---------------------------------------------------------------------- PERFORMED BY:  Performed By:     Verne Grain            Ref. Address:     [address]                                                             [place], [place],                                                             [place] [address]  Referred By:      Farrel Conners CNM ---------------------------------------------------------------------- SERVICE(S) PROVIDED:   [place] MFM FETAL BPP WO NON STRESS                       [phone]  ---------------------------------------------------------------------- INDICATIONS:   [redacted] weeks gestation of pregnancy                Z3A.34  ---------------------------------------------------------------------- FETAL EVALUATION:  Num Of Fetuses:         1  Fetal Heart Rate(bpm):  155  Fetal Lie:              Maternal Right  Presentation:           Vertex  Placenta:               Anterior Grade  2/3, No previa  Amniotic Fluid  AFI FV:      Within normal limits  AFI Sum(cm)     %Tile       Largest Pocket(cm)  14.7            53          5.5 ---------------------------------------------------------------------- BIOPHYSICAL EVALUATION:  Amniotic F.V:   Within normal limits       F. Tone:        Observed  F. Movement:    Observed                   Score:           8/8  F. Breathing:   Observed ---------------------------------------------------------------------- GESTATIONAL AGE:  LMP:           34w 6d        Date:  06/18/19                 EDD:   03/24/20  Best:          34w 6d     Det. By:  LMP  (06/18/19)          EDD:   03/24/20 ---------------------------------------------------------------------- IMPRESSION:  Dear Ms Sharen Hones  ,  Thank you for referring your patient for a fetal biophysical  profile (BPP) due to Type 2 diabetes on insulin and chronic  HTN on meds.  The patient is at 34w 6d based on LMP consistent with  earliest scan on 08/12/20 at 8w 5d at [facility]  There is a singleton gestation with normal amniotic fluid  volume.  The BPP was noted to be 8/8, which was reassuring.  Follow-up BPP is advised in 1 week  I increased her pre supper humalog to 10 units ( now on  Lantus 26 units and humalog 05/31/09) [facility]  [place]  Thank you for allowing [place] to participate in your patient's care.  Please do not hesitate to contact us if we can be of further  assistance. ----------------------------------------------------------------------              Jimmey Ralph, MD Electronically Signed Final Report   02/17/2020 10:40 am ----------------------------------------------------------------------   Review of Systems  Constitutional: Negative for chills and fever.  HENT: Negative for congestion, hearing loss and sinus pain.   Respiratory: Negative for cough, shortness of breath and wheezing.   Cardiovascular: Negative for chest pain, palpitations and leg swelling.  Gastrointestinal: Negative for abdominal pain, constipation, diarrhea, nausea and vomiting.  Genitourinary: Negative for dysuria, flank pain, frequency, hematuria and urgency.  Musculoskeletal: Negative for back pain.  Skin: Negative for rash.  Neurological: Negative for dizziness and headaches.  Psychiatric/Behavioral: Negative for suicidal ideas. The patient is not  nervous/anxious.     Blood pressure 110/68, height 5' (1.524 m), weight 157 lb (71.2 kg), last menstrual period 06/18/2019. Physical Exam  Nursing note and vitals reviewed. Constitutional: She is oriented to person, place, and time. She appears well-developed and well-nourished.  HENT:  Head: Normocephalic and atraumatic.  Cardiovascular: Normal rate and regular rhythm.  Respiratory: Effort normal and breath sounds normal.  GI: Soft. Bowel sounds are normal.  Musculoskeletal:        General: Normal range of motion.  Neurological: She is alert and oriented to person, place, and time.  Skin: Skin is warm and dry.  Psychiatric: She has a normal mood and affect. Her behavior is normal. Judgment and thought content normal.     Assessment/Plan  34 y.o. [id number] at [redacted]w[redacted]d with history of prior cesarean section who desires an elective repeat cesarean section and sterilization.   Discussed risks, benefits, and alternatives with the patient for the cesarean section.  Discussed the risk of infection bleeding and damage to surrounding pelvic tissues including but not limited to the uterus, fallopian tubes, ovaries, bowel, and bladder.  Discussed proper care for the incision postoperatively to avoid infection and signs and symptoms of infection to watch for.  Discussed possibility of an emergent blood transfusion for heavy blood loss.  Discussed risks associated with blood transfusion including transfusion reaction and chronic infections, or sepsis which could lead to death.  Patient gave consent for the procedure and blood transfusion if needed. Consent  forms were completed.   Natale Milch, MD 02/19/2020, 9:26 AM

## 2020-02-19 NOTE — Progress Notes (Signed)
Deanna Zimmerman is an 34 y.o. female.   Chief Complaint: history of prior cesarean section. Desires repeat cesarean section and Sterilization HPI:  34 yo G3P2002 [redacted]w[redacted]d here fore a preoperative visit. She has no complaints.  pregnancy Problems (from 08/01/19 to present)    Problem Noted Resolved   Chronic hypertension affecting pregnancy 08/13/2019 by Gutierrez, Colleen, CNM No   Overview Addendum 11/20/2019 10:35 AM by Harris, Robert P, MD    [x ] Aspirin 81 mg daily after 12 weeks; discontinue after 36 weeks [x ] baseline labs with CBC, CMP, urine protein/creatinine ratio [ ] no BP meds unless BPs become elevated Labetalol started, 100 BID as of 12/1 [ ] ultrasound for growth at 28, 32, 36 weeks [ ] Baseline EKG   Current antihypertensives:  Labetalol 200 mgm BID (Losartan and HCTZ were discontinued)  Baseline and surveillance labs (pulled in from EPIC, refresh links as needed)  Lab Results  Component Value Date   PLT 388 08/01/2019   CREATININE 0.56 (L) 08/07/2019   AST 16 08/07/2019   ALT 10 08/07/2019    Antenatal Testing CHTN - O10.919  Group I  BP < 140/90, no preeclampsia, AGA,  nml AFV, +/- meds    Group II BP > 140/90, on meds, no preeclampsia, AGA, nml AFV  20-28-34-38  20-24-28-32-35-38  32//2 x wk  28//BPP wkly then 32//2 x wk  40 no meds; 39 meds  PRN or 37  Pre-eclampsia  GHTN - O13.9/Preeclampsia without severe features  - O14.00   Preeclampsia with severe features - O14.10  Q 3-4wks  Q 2 wks  28//BPP wkly then 32//2 x wk  Inpatient  37  PRN or 34   UPDATED PLAN: (1/27) MonthlyUSgrowth surveillance beginning at 28 weeksfollowed by weekly antenatal testing beginning at 32 weeks and twice weekly testing beginning at 34 weekswith delivery at 37-39 weeks depending on maternal and fetal status.      Previous cesarean delivery affecting pregnancy, antepartum 08/13/2019 by Gutierrez, Colleen, CNM No   Overview Addendum 02/06/2020  8:45 AM by  Livingston, Elizabeth, MD    Hx of 2 Cesarean sections Plan CS given IDDM and HTN would perform at 37-39 weeks  Desires BTL as well      Pre-existing type 2 diabetes mellitus during pregnancy, antepartum 08/13/2019 by Gutierrez, Colleen, CNM No   Overview Addendum 01/30/2020  8:28 AM by Livingston, Elizabeth, MD    Current Diabetic Medications:  Lantus Insulin,  Metformin    Long-acting   * Lantus 26 units humalog Humalog 8 u tid    * Metformin [x ] Aspirin 81 mg daily after 12 weeks; discontinue after 36 weeks (? A2/B GDM)    For A2/B GDM or higher classes of DM [x ] Diabetes Education and Testing Supplies [x ] Nutrition Counsult [x ] Fetal ECHO after 22-24 weeks (fetal echo normal on 1/21)  [x ] Eye exam for retina evaluation  [x ] Baseline EKG [ x] US fetal growth every 4 weeks starting at 28 weeks [x ] Twice weekly NST starting at [redacted] weeks gestation [x ] Delivery planning contingent on fetal growth, AFI, glycemic control, and other co-morbidities but at least by 39 weeks  Baseline and surveillance labs (pulled in from EPIC, refresh links as needed)  Lab Results  Component Value Date   CREATININE 0.56 (L) 08/07/2019   AST 16 08/07/2019   ALT 10 08/07/2019   TSH 0.841 09/26/2018   Lab Results  Component Value Date     HGBA1C 7.7 (H) 08/07/2019   HGBA1C 7.7 (H) 05/29/2019   HGBA1C 8.8 (H) 04/10/2019    Antenatal Testing Class of DM U/S NST/AFI DELIVERY  Diabetes   A1 - good control - O24.410    A2 - good control - O24.419      A2  - poor control or poor compliance - O24.419, E11.65   (Macrosomia or polyhydramnios) **E11.65 is extra code for poor control**    A2/B - O24.919  and B-C O24.319  Poor control B-C or D-R-F-T - O24.319  or  Type I DM - O24.019  20-38  20-38  20-24-28-32-36   20-24-28-32-35-38//fetal echo  20-24-27-30-33-36-38//fetal echo  40  32//2 x wk  32//2 x wk   32//2 x wk  28//BPP wkly then 32//2 x wk   40  39  PRN   39  PRN   Has Bionime glucometer 10/14 hemoglobin A1c was 7.7%      Supervision of high risk pregnancy, antepartum 08/01/2019 by Rod Can, CNM No   Overview Addendum 02/09/2020  6:35 PM by [name], [place] Prenatal Labs  Dating [place] Blood type: O/Positive/-- (10/08 1549) O POSITIVE  Genetic Screen declines Antibody:Negative (10/08 1549)negative  Anatomic [place] DP MFM Rubella: 3.99 (10/08 1549) Immune Varicella: Immue  GTT Type 2 DM RPR: Non Reactive (10/08 1549) negative  Rhogam n/a HBsAg: Negative (10/08 1549) negative  Vaccines TDAP:  01/20/20                     Flu Shot: HIV: Non Reactive (10/08 1549) non reactive  Baby Food Formula                              GBS:   Contraception BTL, consent 30 wks [x]  01/13/2020 Pap: at [facility] June 2020, neg per pt report  CBB  No Hemoglobin A1C 7.7% on 10/14  CS/VBAC 2006 primary for FTP 8#, 2009 repeat 9#10oz Repeat scheduled for 03/05/2020   Support Person Partner Sonia Side- 6'9" tall               Past Medical History:  Diagnosis Date  . Diabetes mellitus ([place])   . Diabetes mellitus without complication ([place])   . Goiter 02/07/2017  . Hypertension     Past Surgical History:  Procedure Laterality Date  . CESAREAN SECTION    . FRACTURE SURGERY      Family History  Problem Relation Age of Onset  . Diabetes Sister   . Diabetes Paternal Aunt    Social History:  reports that she has never smoked. She has never used smokeless tobacco. She reports that she does not drink alcohol or use drugs.  Allergies: No Known Allergies  (Not in a hospital admission)   Results for orders placed or performed in visit on 02/19/20 (from the past 48 hour(s))  POC Urinalysis Dipstick OB     Status: None   Collection Time: 02/19/20  9:02 AM  Result Value Ref Range   Color, UA     Clarity, UA     Glucose, UA Negative Negative   Bilirubin, UA     Ketones, UA     Spec Grav, UA     Blood, UA      pH, UA     POC,PROTEIN,UA Negative Negative, Trace, Small (1+), Moderate (2+), Large (3+), 4+   Urobilinogen, UA     Nitrite, UA  Leukocytes, UA     Appearance     Odor     [place] MFM FETAL BPP WO NON STRESS  Result Date: 02/17/2020 ----------------------------------------------------------------------  OBSTETRICS REPORT                       (Signed Final 02/17/2020 10:40 am) ---------------------------------------------------------------------- PATIENT INFO:  ID #:       [id number]                          D.O.B.:  01-20-1986 (34 yrs)  Name:       Deanna Zimmerman              Visit Date: 02/17/2020 10:04 am ---------------------------------------------------------------------- PERFORMED BY:  Performed By:     Verne Grain            Ref. Address:     [address]                                                             [place], [place],                                                             [place] [address]  Referred By:      Farrel Conners CNM ---------------------------------------------------------------------- SERVICE(S) PROVIDED:   [place] MFM FETAL BPP WO NON STRESS                       [phone]  ---------------------------------------------------------------------- INDICATIONS:   [redacted] weeks gestation of pregnancy                Z3A.34  ---------------------------------------------------------------------- FETAL EVALUATION:  Num Of Fetuses:         1  Fetal Heart Rate(bpm):  155  Fetal Lie:              Maternal Right  Presentation:           Vertex  Placenta:               Anterior Grade  2/3, No previa  Amniotic Fluid  AFI FV:      Within normal limits  AFI Sum(cm)     %Tile       Largest Pocket(cm)  14.7            53          5.5 ---------------------------------------------------------------------- BIOPHYSICAL EVALUATION:  Amniotic F.V:   Within normal limits       F. Tone:        Observed  F. Movement:    Observed                   Score:           8/8  F. Breathing:   Observed ---------------------------------------------------------------------- GESTATIONAL AGE:  LMP:           34w 6d        Date:  06/18/19                 EDD:   03/24/20  Best:          34w 6d     Det. By:  LMP  (06/18/19)          EDD:   03/24/20 ---------------------------------------------------------------------- IMPRESSION:  Dear Ms Sharen Hones  ,  Thank you for referring your patient for a fetal biophysical  profile (BPP) due to Type 2 diabetes on insulin and chronic  HTN on meds.  The patient is at 34w 6d based on LMP consistent with  earliest scan on 08/12/20 at 8w 5d at [facility]  There is a singleton gestation with normal amniotic fluid  volume.  The BPP was noted to be 8/8, which was reassuring.  Follow-up BPP is advised in 1 week  I increased her pre supper humalog to 10 units ( now on  Lantus 26 units and humalog 05/31/09) refill sent to [facility] in  [place]  Thank you for allowing [place] to participate in your patient's care.  Please do not hesitate to contact us if we can be of further  assistance. ----------------------------------------------------------------------              Jimmey Ralph, MD Electronically Signed Final Report   02/17/2020 10:40 am ----------------------------------------------------------------------   Review of Systems  Constitutional: Negative for chills and fever.  HENT: Negative for congestion, hearing loss and sinus pain.   Respiratory: Negative for cough, shortness of breath and wheezing.   Cardiovascular: Negative for chest pain, palpitations and leg swelling.  Gastrointestinal: Negative for abdominal pain, constipation, diarrhea, nausea and vomiting.  Genitourinary: Negative for dysuria, flank pain, frequency, hematuria and urgency.  Musculoskeletal: Negative for back pain.  Skin: Negative for rash.  Neurological: Negative for dizziness and headaches.  Psychiatric/Behavioral: Negative for suicidal ideas. The patient is not  nervous/anxious.     Blood pressure 110/68, height 5' (1.524 m), weight 157 lb (71.2 kg), last menstrual period 06/18/2019. Physical Exam  Nursing note and vitals reviewed. Constitutional: She is oriented to person, place, and time. She appears well-developed and well-nourished.  HENT:  Head: Normocephalic and atraumatic.  Cardiovascular: Normal rate and regular rhythm.  Respiratory: Effort normal and breath sounds normal.  GI: Soft. Bowel sounds are normal.  Musculoskeletal:        General: Normal range of motion.  Neurological: She is alert and oriented to person, place, and time.  Skin: Skin is warm and dry.  Psychiatric: She has a normal mood and affect. Her behavior is normal. Judgment and thought content normal.     Assessment/Plan  34 y.o. [id number] at [redacted]w[redacted]d with history of prior cesarean section who desires an elective repeat cesarean section and sterilization.   Discussed risks, benefits, and alternatives with the patient for the cesarean section.  Discussed the risk of infection bleeding and damage to surrounding pelvic tissues including but not limited to the uterus, fallopian tubes, ovaries, bowel, and bladder.  Discussed proper care for the incision postoperatively to avoid infection and signs and symptoms of infection to watch for.  Discussed possibility of an emergent blood transfusion for heavy blood loss.  Discussed risks associated with blood transfusion including transfusion reaction and chronic infections, or sepsis which could lead to death.  Patient gave consent for the procedure and blood transfusion if needed. Consent  forms were completed.   Natale Milch, MD 02/19/2020, 9:26 AM

## 2020-02-20 ENCOUNTER — Ambulatory Visit (INDEPENDENT_AMBULATORY_CARE_PROVIDER_SITE_OTHER): Payer: Medicaid Other | Admitting: Obstetrics and Gynecology

## 2020-02-20 ENCOUNTER — Other Ambulatory Visit: Payer: Self-pay | Admitting: Obstetrics and Gynecology

## 2020-02-20 ENCOUNTER — Encounter: Payer: Self-pay | Admitting: Obstetrics and Gynecology

## 2020-02-20 ENCOUNTER — Other Ambulatory Visit (INDEPENDENT_AMBULATORY_CARE_PROVIDER_SITE_OTHER): Payer: Medicaid Other

## 2020-02-20 VITALS — BP 116/70 | Wt 162.0 lb

## 2020-02-20 DIAGNOSIS — Z3A35 35 weeks gestation of pregnancy: Secondary | ICD-10-CM

## 2020-02-20 DIAGNOSIS — O34219 Maternal care for unspecified type scar from previous cesarean delivery: Secondary | ICD-10-CM

## 2020-02-20 DIAGNOSIS — O0990 Supervision of high risk pregnancy, unspecified, unspecified trimester: Secondary | ICD-10-CM | POA: Diagnosis not present

## 2020-02-20 DIAGNOSIS — E119 Type 2 diabetes mellitus without complications: Secondary | ICD-10-CM

## 2020-02-20 DIAGNOSIS — Z3689 Encounter for other specified antenatal screening: Secondary | ICD-10-CM | POA: Diagnosis not present

## 2020-02-20 DIAGNOSIS — E1165 Type 2 diabetes mellitus with hyperglycemia: Secondary | ICD-10-CM

## 2020-02-20 DIAGNOSIS — O10919 Unspecified pre-existing hypertension complicating pregnancy, unspecified trimester: Secondary | ICD-10-CM

## 2020-02-20 DIAGNOSIS — O24119 Pre-existing diabetes mellitus, type 2, in pregnancy, unspecified trimester: Secondary | ICD-10-CM

## 2020-02-20 DIAGNOSIS — O24112 Pre-existing diabetes mellitus, type 2, in pregnancy, second trimester: Secondary | ICD-10-CM

## 2020-02-20 LAB — FETAL NONSTRESS TEST

## 2020-02-20 NOTE — Progress Notes (Signed)
Routine Prenatal Care Visit  Subjective  Deanna Zimmerman is a 34 y.o. G3P2002 at [redacted]w[redacted]d being seen today for ongoing prenatal care.  She is currently monitored for the following issues for this high-risk pregnancy and has Diabetes mellitus, new onset (HCC); Hypertension; Goiter; Type 2 diabetes mellitus with hyperglycemia, without long-term current use of insulin (HCC); Supervision of high risk pregnancy, antepartum; Chronic hypertension affecting pregnancy; Previous cesarean delivery affecting pregnancy, antepartum; Pre-existing type 2 diabetes mellitus during pregnancy, antepartum; and Contraception desires tubal ligation on their problem list.  ----------------------------------------------------------------------------------- Patient reports no complaints.   Contractions: Irregular. Vag. Bleeding: None.  Movement: Present. Denies leaking of fluid.  ----------------------------------------------------------------------------------- The following portions of the patient's history were reviewed and updated as appropriate: allergies, current medications, past family history, past medical history, past social history, past surgical history and problem list. Problem list updated.   Objective  Blood pressure 116/70, weight 162 lb (73.5 kg), last menstrual period 06/18/2019. Pregravid weight 145 lb (65.8 kg) Total Weight Gain 17 lb (7.711 kg) Urinalysis:      Fetal Status: Fetal Heart Rate (bpm): 130   Movement: Present  Presentation: Vertex  General:  Alert, oriented and cooperative. Patient is in no acute distress.  Skin: Skin is warm and dry. No rash noted.   Cardiovascular: Normal heart rate noted  Respiratory: Normal respiratory effort, no problems with respiration noted  Abdomen: Soft, gravid, appropriate for gestational age. Pain/Pressure: Present     Pelvic:  Cervical exam deferred        Extremities: Normal range of motion.     ental Status: Normal mood and affect. Normal  behavior. Normal judgment and thought content.   Baseline: 130 Variability: moderate Accelerations: present Decelerations: absent Tocometry: N/A The patient was monitored for 30 minutes, fetal heart rate tracing was deemed reactive, category I tracing,  CPT 59025   US OB Limited  Result Date: 02/20/2020 Patient Name: Deanna Zimmerman DOB: 1986/05/21 MRN: [id number] ULTRASOUND REPORT Location: [facility] Date of Service: 02/20/2020 Indications:AFI Findings: Mason Jim intrauterine pregnancy is visualized with FHR at 160 BPM. Fetal presentation is Cephalic. Placenta: anterior. Grade: 3 AFI: 21.9 cm Impression: 1. [redacted]w[redacted]d Viable Singleton Intrauterine pregnancy dated by previously established criteria. 2. AFI is 21.9 cm. Recommendations: 1.Clinical correlation with the patient's History and Physical Exam. Deanna Artis, RT There is a singleton gestation with normal amniotic fluid volume. The visualized fetal anatomy appears within normal limits within the resolution of ultrasound as described above.  It must be noted that a normal ultrasound is unable to rule out fetal aneuploidy.  Vena Austria, MD, [name], [facility] 02/20/2020, 10:08 AM   US OB Limited  Result Date: 02/19/2020 Patient Name: Deanna Zimmerman DOB: 15-Apr-1986 MRN: [id number] ULTRASOUND REPORT Location: [facility] Date of Service: 02/05/2020 Indications:AFI Findings: Mason Jim intrauterine pregnancy is visualized with FHR at 157 BPM. Fetal presentation is Cephalic. Placenta: fundal. Grade: 2 AFI: 15.6 cm Impression: 1. [redacted]w[redacted]d Viable Singleton Intrauterine pregnancy dated by previously established criteria. 2. AFI is 15.6 cm. Recommendations: 1.Clinical correlation with the patient's History and Physical Exam. Deanna Artis, RT I have reviewed this ultrasound and the report. I agree with the above assessment and plan. Adelene Idler MD [facility] 02/19/20  12:34 AM    Date Fasting Breakfast Lunch Dinner  4/26 80 135 77 125  4/27 90 130 140 134  4/28 83 81 111 134  4/29 81  Assessment   34 y.o. G3P2002 at [redacted]w[redacted]d by  03/24/2020, by Last Menstrual Period presenting for routine prenatal visit  Plan   pregnancy Problems (from 08/01/19 to present)    Problem Noted Resolved   Chronic hypertension affecting pregnancy 08/13/2019 by Farrel Conners, CNM No   Overview Addendum 11/20/2019 10:35 AM by [name], MD    [x ] Aspirin 81 mg daily after 12 weeks; discontinue after 36 weeks [x ] baseline labs with CBC, CMP, urine protein/creatinine ratio [ ]  no BP meds unless BPs become elevated Labetalol started, 100 BID as of 12/1 [ ]  ultrasound for growth at 28, 32, 36 weeks [ ]  Baseline EKG   Current antihypertensives:  Labetalol 200 mgm BID (Losartan and HCTZ were discontinued)  Baseline and surveillance labs (pulled in from [facility], refresh links as needed)  Lab Results  Component Value Date   PLT 388 08/01/2019   CREATININE 0.56 (L) 08/07/2019   AST 16 08/07/2019   ALT 10 08/07/2019    Antenatal Testing CHTN - O10.919  Group I  BP < 140/90, no preeclampsia, AGA,  nml AFV, +/- meds    Group II BP > 140/90, on meds, no preeclampsia, AGA, nml AFV  20-28-34-38  20-24-28-32-35-38  32//2 x wk  28//BPP wkly then 32//2 x wk  40 no meds; 39 meds  PRN or 37  Pre-eclampsia  GHTN - O13.9/Preeclampsia without severe features  - O14.00   Preeclampsia with severe features - O14.10  Q 3-4wks  Q 2 wks  28//BPP wkly then 32//2 x wk  Inpatient  37  PRN or 34   UPDATED PLAN: (1/27) MonthlyUSgrowth surveillance beginning at 28 weeksfollowed by weekly antenatal testing beginning at 32 weeks and twice weekly testing beginning at 34 weekswith delivery at 37-39 weeks depending on maternal and fetal status.      Previous cesarean delivery affecting pregnancy, antepartum 08/13/2019 by 07-22-1989, CNM No   Overview  Addendum 02/06/2020  8:45 AM by 08/15/2019, MD    Hx of 2 Cesarean sections Plan CS given IDDM and HTN would perform at 37-39 weeks  Desires BTL as well      Pre-existing type 2 diabetes mellitus during pregnancy, antepartum 08/13/2019 by 02/08/2020, CNM No   Overview Addendum 01/30/2020  8:28 AM by 08/15/2019, MD    Current Diabetic Medications:  Lantus Insulin,  Metformin    Long-acting   * Lantus 26 units humalog Humalog 8 u tid    * Metformin [x ] Aspirin 81 mg daily after 12 weeks; discontinue after 36 weeks (? A2/B GDM)    For A2/B GDM or higher classes of DM [x ] Diabetes Education and Testing Supplies [x ] Nutrition Counsult [x ] Fetal ECHO after 22-24 weeks (fetal echo normal on 1/21)  [x ] Eye exam for retina evaluation  [x ] Baseline EKG [ x] 03/31/2020 fetal growth every 4 weeks starting at 28 weeks [x ] Twice weekly NST starting at [redacted] weeks gestation [x ] Delivery planning contingent on fetal growth, AFI, glycemic control, and other co-morbidities but at least by 39 weeks  Baseline and surveillance labs (pulled in from [facility], refresh links as needed)  Lab Results  Component Value Date   CREATININE 0.56 (L) 08/07/2019   AST 16 08/07/2019   ALT 10 08/07/2019   TSH 0.841 09/26/2018   Lab Results  Component Value Date   HGBA1C 7.7 (H) 08/07/2019   HGBA1C 7.7 (H) 05/29/2019   HGBA1C 8.8 (H)  04/10/2019    Antenatal Testing Class of DM U/S NST/AFI DELIVERY  Diabetes   A1 - good control - O24.410    A2 - good control - O24.419      A2  - poor control or poor compliance - O24.419, E11.65   (Macrosomia or polyhydramnios) **E11.65 is extra code for poor control**    A2/B - O24.919  and B-C O24.319  Poor control B-C or D-R-F-T - O24.319  or  Type I DM - O24.019  20-38  20-38  20-24-28-32-36   20-24-28-32-35-38//fetal echo  20-24-27-30-33-36-38//fetal echo  40  32//2 x wk  32//2 x wk   32//2 x wk  28//BPP wkly then 32//2 x wk   40  39  PRN   39  PRN   Has Bionime glucometer 10/14 hemoglobin A1c was 7.7%      Supervision of high risk pregnancy, antepartum 08/01/2019 by Rod Can, CNM No   Overview Addendum 02/09/2020  6:35 PM by [name], [place] Prenatal Labs  Dating [place] Blood type: O/Positive/-- (10/08 1549) O POSITIVE  Genetic Screen declines Antibody:Negative (10/08 1549)negative  Anatomic [place] DP MFM Rubella: 3.99 (10/08 1549) Immune Varicella: Immue  GTT Type 2 DM RPR: Non Reactive (10/08 1549) negative  Rhogam n/a HBsAg: Negative (10/08 1549) negative  Vaccines TDAP:  01/20/20                     Flu Shot: HIV: Non Reactive (10/08 1549) non reactive  Baby Food Formula                              GBS:   Contraception BTL, consent 30 wks [x]  01/13/2020 Pap: at [facility] June 2020, neg per pt report  CBB  No Hemoglobin A1C 7.7% on 10/14  CS/VBAC 2006 primary for FTP 8#, 2009 repeat 9#10oz Repeat scheduled for 03/05/2020   Support Person Partner Sonia Side- 6'9" tall              Gestational age appropriate obstetric precautions including but not limited to vaginal bleeding, contractions, leaking of fluid and fetal movement were reviewed in detail with the patient.    - Currently on 05/31/09 short acting increase to 06/01/11 for mild continued elevation in dinner values, no change to long acting  Return in about 4 days (around 02/24/2020) for 02/24/2020 ROB/NST; 02/27/2020 ROB/NST/Growth.  Malachy Mood, MD, [facility], [facility] 02/20/2020, 10:22 AM

## 2020-02-20 NOTE — Progress Notes (Signed)
Lactation student attempted to discuss benefits of breastfeeding but [name] declined.  She has choosen to formula feed. Lactation student discussed benefits of STS.  Student also discussed benefits of exclusively pumping.

## 2020-02-24 ENCOUNTER — Other Ambulatory Visit (HOSPITAL_COMMUNITY)
Admission: RE | Admit: 2020-02-24 | Discharge: 2020-02-24 | Disposition: A | Discharge status: Home / Self Care | Payer: Medicaid Other | Source: Ambulatory Visit | Attending: Obstetrics and Gynecology | Admitting: Obstetrics and Gynecology

## 2020-02-24 ENCOUNTER — Other Ambulatory Visit: Payer: Self-pay

## 2020-02-24 ENCOUNTER — Encounter
Admission: RE | Admit: 2020-02-24 | Discharge: 2020-02-24 | Disposition: A | Discharge status: Home / Self Care | Payer: Medicaid Other | Source: Ambulatory Visit | Attending: Obstetrics and Gynecology | Admitting: Obstetrics and Gynecology

## 2020-02-24 ENCOUNTER — Ambulatory Visit
Admission: RE | Admit: 2020-02-24 | Discharge: 2020-02-24 | Disposition: A | Discharge status: Home / Self Care | Payer: Medicaid Other | Source: Ambulatory Visit | Attending: Maternal & Fetal Medicine | Admitting: Maternal & Fetal Medicine

## 2020-02-24 ENCOUNTER — Ambulatory Visit (INDEPENDENT_AMBULATORY_CARE_PROVIDER_SITE_OTHER): Payer: Medicaid Other | Admitting: Obstetrics and Gynecology

## 2020-02-24 ENCOUNTER — Encounter: Payer: Self-pay | Admitting: Obstetrics and Gynecology

## 2020-02-24 ENCOUNTER — Ambulatory Visit: Payer: Medicaid Other

## 2020-02-24 VITALS — BP 118/78 | Wt 159.0 lb

## 2020-02-24 DIAGNOSIS — O34219 Maternal care for unspecified type scar from previous cesarean delivery: Secondary | ICD-10-CM

## 2020-02-24 DIAGNOSIS — O24112 Pre-existing diabetes mellitus, type 2, in pregnancy, second trimester: Secondary | ICD-10-CM

## 2020-02-24 DIAGNOSIS — O24119 Pre-existing diabetes mellitus, type 2, in pregnancy, unspecified trimester: Secondary | ICD-10-CM

## 2020-02-24 DIAGNOSIS — E1165 Type 2 diabetes mellitus with hyperglycemia: Secondary | ICD-10-CM

## 2020-02-24 DIAGNOSIS — O0990 Supervision of high risk pregnancy, unspecified, unspecified trimester: Secondary | ICD-10-CM

## 2020-02-24 DIAGNOSIS — O24113 Pre-existing diabetes mellitus, type 2, in pregnancy, third trimester: Secondary | ICD-10-CM | POA: Diagnosis not present

## 2020-02-24 DIAGNOSIS — Z794 Long term (current) use of insulin: Secondary | ICD-10-CM | POA: Diagnosis not present

## 2020-02-24 DIAGNOSIS — O10919 Unspecified pre-existing hypertension complicating pregnancy, unspecified trimester: Secondary | ICD-10-CM

## 2020-02-24 DIAGNOSIS — O10913 Unspecified pre-existing hypertension complicating pregnancy, third trimester: Secondary | ICD-10-CM | POA: Insufficient documentation

## 2020-02-24 DIAGNOSIS — Z3A35 35 weeks gestation of pregnancy: Secondary | ICD-10-CM | POA: Diagnosis not present

## 2020-02-24 DIAGNOSIS — E119 Type 2 diabetes mellitus without complications: Secondary | ICD-10-CM | POA: Diagnosis not present

## 2020-02-24 LAB — GLUCOSE, CAPILLARY: Glucose-Capillary: 141 mg/dL — ABNORMAL HIGH (ref 70–99)

## 2020-02-24 NOTE — Progress Notes (Signed)
Nice patient

## 2020-02-24 NOTE — Progress Notes (Signed)
Routine Prenatal Care Visit  Subjective  Deanna Zimmerman is a 34 y.o. G3P2002 at [redacted]w[redacted]d being seen today for ongoing prenatal care.  She is currently monitored for the following issues for this high-risk pregnancy and has Diabetes mellitus, new onset (HCC); Hypertension; Goiter; Type 2 diabetes mellitus with hyperglycemia, without long-term current use of insulin (HCC); Supervision of high risk pregnancy, antepartum; Chronic hypertension affecting pregnancy; Previous cesarean delivery affecting pregnancy, antepartum; Pre-existing type 2 diabetes mellitus during pregnancy, antepartum; and Contraception desires tubal ligation on their problem list.  ----------------------------------------------------------------------------------- Patient reports no complaints. Better glucose control with increase in dinner insulin.   Contractions: Irregular. Vag. Bleeding: None.  Movement: Present. Denies leaking of fluid.  ----------------------------------------------------------------------------------- The following portions of the patient's history were reviewed and updated as appropriate: allergies, current medications, past family history, past medical history, past social history, past surgical history and problem list. Problem list updated.   Objective  Blood pressure 118/78, weight 159 lb (72.1 kg), last menstrual period 06/18/2019. Pregravid weight 145 lb (65.8 kg) Total Weight Gain 14 lb (6.35 kg) Urinalysis:      Fetal Status: Fetal Heart Rate (bpm): 150   Movement: Present     General:  Alert, oriented and cooperative. Patient is in no acute distress.  Skin: Skin is warm and dry. No rash noted.   Cardiovascular: Normal heart rate noted  Respiratory: Normal respiratory effort, no problems with respiration noted  Abdomen: Soft, gravid, appropriate for gestational age. Pain/Pressure: Present     Pelvic:  Cervical exam performed Dilation: Closed Effacement (%): 0 Station: -3  Extremities:  Normal range of motion.  Edema: None  Mental Status: Normal mood and affect. Normal behavior. Normal judgment and thought content.     Date Fasting 2 hrs after breakfast 2 hrs after lunch 2 hrs after dinner   02/20/2020 81 70 85 123   02/21/2020 81 131 131 125   02/22/2020 57 115 78 117   02/23/2020 81 84 93 109   02/24/2020 79        NST: 150 bpm baseline, MODERATE variability, 15x15 accelerations, no decelerations.  Assessment   34 y.o. [id number] at [redacted]w[redacted]d by  03/24/2020, by Last Menstrual Period presenting for routine prenatal visit  Plan   pregnancy Problems (from 08/01/19 to present)    Problem Noted Resolved   Chronic hypertension affecting pregnancy 08/13/2019 by Farrel Conners, CNM No   Overview Addendum 11/20/2019 10:35 AM by [name], MD    [x ] Aspirin 81 mg daily after 12 weeks; discontinue after 36 weeks [x ] baseline labs with CBC, CMP, urine protein/creatinine ratio [ ]  no BP meds unless BPs become elevated Labetalol started, 100 BID as of 12/1 [ ]  ultrasound for growth at 28, 32, 36 weeks [ ]  Baseline EKG   Current antihypertensives:  Labetalol 200 mgm BID (Losartan and HCTZ were discontinued)  Baseline and surveillance labs (pulled in from [facility], refresh links as needed)  Lab Results  Component Value Date   PLT 388 08/01/2019   CREATININE 0.56 (L) 08/07/2019   AST 16 08/07/2019   ALT 10 08/07/2019    Antenatal Testing CHTN - O10.919  Group I  BP < 140/90, no preeclampsia, AGA,  nml AFV, +/- meds    Group II BP > 140/90, on meds, no preeclampsia, AGA, nml AFV  20-28-34-38  20-24-28-32-35-38  32//2 x wk  28//BPP wkly then 32//2 x wk  40 no meds; 39 meds  PRN or 37  Pre-eclampsia  GHTN - O13.9/Preeclampsia without severe features  - O14.00   Preeclampsia with severe features - O14.10  Q 3-4wks  Q 2 wks  28//BPP wkly then 32//2 x wk  Inpatient  37  PRN or 34   UPDATED PLAN: (1/27) MonthlyUSgrowth surveillance beginning at 28  weeksfollowed by weekly antenatal testing beginning at 32 weeks and twice weekly testing beginning at 34 weekswith delivery at 37-39 weeks depending on maternal and fetal status.      Previous cesarean delivery affecting pregnancy, antepartum 08/13/2019 by Farrel Conners, CNM No   Overview Addendum 02/06/2020  8:45 AM by Jimmey Ralph, MD    Hx of 2 Cesarean sections Plan CS given IDDM and HTN would perform at 37-39 weeks  Desires BTL as well      Pre-existing type 2 diabetes mellitus during pregnancy, antepartum 08/13/2019 by Farrel Conners, CNM No   Overview Addendum 01/30/2020  8:28 AM by Jimmey Ralph, MD    Current Diabetic Medications:  Lantus Insulin,  Metformin    Long-acting   * Lantus 26 units humalog Humalog 8 u tid    * Metformin [x ] Aspirin 81 mg daily after 12 weeks; discontinue after 36 weeks (? A2/B GDM)    For A2/B GDM or higher classes of DM [x ] Diabetes Education and Testing Supplies [x ] Nutrition Counsult [x ] Fetal ECHO after 22-24 weeks (fetal echo normal on 1/21)  [x ] Eye exam for retina evaluation  [x ] Baseline EKG [ x] US fetal growth every 4 weeks starting at 28 weeks [x ] Twice weekly NST starting at [redacted] weeks gestation [x ] Delivery planning contingent on fetal growth, AFI, glycemic control, and other co-morbidities but at least by 39 weeks  Baseline and surveillance labs (pulled in from [facility], refresh links as needed)  Lab Results  Component Value Date   CREATININE 0.56 (L) 08/07/2019   AST 16 08/07/2019   ALT 10 08/07/2019   TSH 0.841 09/26/2018   Lab Results  Component Value Date   HGBA1C 7.7 (H) 08/07/2019   HGBA1C 7.7 (H) 05/29/2019   HGBA1C 8.8 (H) 04/10/2019    Antenatal Testing Class of DM U/S NST/AFI DELIVERY  Diabetes   A1 - good control - O24.410    A2 - good control - O24.419      A2  - poor control or poor compliance - O24.419, E11.65   (Macrosomia or polyhydramnios) **E11.65 is extra code for poor  control**    A2/B - O24.919  and B-C O24.319  Poor control B-C or D-R-F-T - O24.319  or  Type I DM - O24.019  20-38  20-38  20-24-28-32-36   20-24-28-32-35-38//fetal echo  20-24-27-30-33-36-38//fetal echo  40  32//2 x wk  32//2 x wk   32//2 x wk  28//BPP wkly then 32//2 x wk  40  39  PRN   39  PRN   Has Bionime glucometer 10/14 hemoglobin A1c was 7.7%      Supervision of high risk pregnancy, antepartum 08/01/2019 by [name], CNM No   Overview Addendum 02/09/2020  6:35 PM by Farrel Conners, CNM    [facility]inic [facility] Prenatal Labs  Dating [place] Blood type: O/Positive/-- (10/08 1549) O POSITIVE  Genetic Screen declines Antibody:Negative (10/08 1549)negative  Anatomic [place] DP MFM Rubella: 3.99 (10/08 1549) Immune Varicella: Immue  GTT Type 2 DM RPR: Non Reactive (10/08 1549) negative  Rhogam n/a HBsAg: Negative (10/08 1549) negative  Vaccines TDAP:  01/20/20  Flu Shot: HIV: Non Reactive (10/08 1549) non reactive  Baby Food Formula                              GBS:   Contraception BTL, consent 30 wks [x]  01/13/2020 Pap: at [place] June 2020, neg per pt report  CBB  No Hemoglobin A1C 7.7% on 10/14  CS/VBAC 2006 primary for FTP 8#, 2009 repeat 9#10oz Repeat scheduled for 03/05/2020   Support Person Partner Sonia Side- 6'9" tall              Continue current insulin regimen, good control demonstrated today.  NST reactive.  GBS GC/CT collected.   Gestational age appropriate obstetric precautions including but not limited to vaginal bleeding, contractions, leaking of fluid and fetal movement were reviewed in detail with the patient.    Return in about 1 week (around 03/02/2020) for ROB/ NST with MD in person.  Homero Fellers MD [facility] 02/24/2020, 10:53 AM

## 2020-02-24 NOTE — Patient Instructions (Signed)
Third Trimester of Pregnancy The third trimester is from week 28 through week 40 (months 7 through 9). The third trimester is a time when the unborn baby (fetus) is growing rapidly. At the end of the ninth month, the fetus is about 20 inches in length and weighs 6-10 pounds. Body changes during your third trimester Your body will continue to go through many changes during pregnancy. The changes vary from woman to woman. During the third trimester:  Your weight will continue to increase. You can expect to gain 25-35 pounds (11-16 kg) by the end of the pregnancy.  You may begin to get stretch marks on your hips, abdomen, and breasts.  You may urinate more often because the fetus is moving lower into your pelvis and pressing on your bladder.  You may develop or continue to have heartburn. This is caused by increased hormones that slow down muscles in the digestive tract.  You may develop or continue to have constipation because increased hormones slow digestion and cause the muscles that push waste through your intestines to relax.  You may develop hemorrhoids. These are swollen veins (varicose veins) in the rectum that can itch or be painful.  You may develop swollen, bulging veins (varicose veins) in your legs.  You may have increased body aches in the pelvis, back, or thighs. This is due to weight gain and increased hormones that are relaxing your joints.  You may have changes in your hair. These can include thickening of your hair, rapid growth, and changes in texture. Some women also have hair loss during or after pregnancy, or hair that feels dry or thin. Your hair will most likely return to normal after your baby is born.  Your breasts will continue to grow and they will continue to become tender. A yellow fluid (colostrum) may leak from your breasts. This is the first milk you are producing for your baby.  Your belly button may stick out.  You may notice more swelling in your hands,  face, or ankles.  You may have increased tingling or numbness in your hands, arms, and legs. The skin on your belly may also feel numb.  You may feel short of breath because of your expanding uterus.  You may have more problems sleeping. This can be caused by the size of your belly, increased need to urinate, and an increase in your body's metabolism.  You may notice the fetus "dropping," or moving lower in your abdomen (lightening).  You may have increased vaginal discharge.  You may notice your joints feel loose and you may have pain around your pelvic bone. What to expect at prenatal visits You will have prenatal exams every 2 weeks until week 36. Then you will have weekly prenatal exams. During a routine prenatal visit:  You will be weighed to make sure you and the baby are growing normally.  Your blood pressure will be taken.  Your abdomen will be measured to track your baby's growth.  The fetal heartbeat will be listened to.  Any test results from the previous visit will be discussed.  You may have a cervical check near your due date to see if your cervix has softened or thinned (effaced).  You will be tested for Group B streptococcus. This happens between 35 and 37 weeks. Your health care provider may ask you:  What your birth plan is.  How you are feeling.  If you are feeling the baby move.  If you have had any abnormal   symptoms, such as leaking fluid, bleeding, severe headaches, or abdominal cramping.  If you are using any tobacco products, including cigarettes, chewing tobacco, and electronic cigarettes.  If you have any questions. Other tests or screenings that may be performed during your third trimester include:  Blood tests that check for low iron levels (anemia).  Fetal testing to check the health, activity level, and growth of the fetus. Testing is done if you have certain medical conditions or if there are problems during the pregnancy.  Nonstress test  (NST). This test checks the health of your baby to make sure there are no signs of problems, such as the baby not getting enough oxygen. During this test, a belt is placed around your belly. The baby is made to move, and its heart rate is monitored during movement. What is false labor? False labor is a condition in which you feel small, irregular tightenings of the muscles in the womb (contractions) that usually go away with rest, changing position, or drinking water. These are called Braxton Hicks contractions. Contractions may last for hours, days, or even weeks before true labor sets in. If contractions come at regular intervals, become more frequent, increase in intensity, or become painful, you should see your health care provider. What are the signs of labor?  Abdominal cramps.  Regular contractions that start at 10 minutes apart and become stronger and more frequent with time.  Contractions that start on the top of the uterus and spread down to the lower abdomen and back.  Increased pelvic pressure and dull back pain.  A watery or bloody mucus discharge that comes from the vagina.  Leaking of amniotic fluid. This is also known as your "water breaking." It could be a slow trickle or a gush. Let your health care provider know if it has a color or strange odor. If you have any of these signs, call your health care provider right away, even if it is before your due date. Follow these instructions at home: Medicines  Follow your health care provider's instructions regarding medicine use. Specific medicines may be either safe or unsafe to take during pregnancy.  Take a prenatal vitamin that contains at least 600 micrograms (mcg) of folic acid.  If you develop constipation, try taking a stool softener if your health care provider approves. Eating and drinking   Eat a balanced diet that includes fresh fruits and vegetables, whole grains, good sources of protein such as meat, eggs, or tofu,  and low-fat dairy. Your health care provider will help you determine the amount of weight gain that is right for you.  Avoid raw meat and uncooked cheese. These carry germs that can cause birth defects in the baby.  If you have low calcium intake from food, talk to your health care provider about whether you should take a daily calcium supplement.  Eat four or five small meals rather than three large meals a day.  Limit foods that are high in fat and processed sugars, such as fried and sweet foods.  To prevent constipation: ? Drink enough fluid to keep your urine clear or pale yellow. ? Eat foods that are high in fiber, such as fresh fruits and vegetables, whole grains, and beans. Activity  Exercise only as directed by your health care provider. Most women can continue their usual exercise routine during pregnancy. Try to exercise for 30 minutes at least 5 days a week. Stop exercising if you experience uterine contractions.  Avoid heavy lifting.  Do   not exercise in extreme heat or humidity, or at high altitudes.  Wear low-heel, comfortable shoes.  Practice good posture.  You may continue to have sex unless your health care provider tells you otherwise. Relieving pain and discomfort  Take frequent breaks and rest with your legs elevated if you have leg cramps or low back pain.  Take warm sitz baths to soothe any pain or discomfort caused by hemorrhoids. Use hemorrhoid cream if your health care provider approves.  Wear a good support bra to prevent discomfort from breast tenderness.  If you develop varicose veins: ? Wear support pantyhose or compression stockings as told by your healthcare provider. ? Elevate your feet for 15 minutes, 3-4 times a day. Prenatal care  Write down your questions. Take them to your prenatal visits.  Keep all your prenatal visits as told by your health care provider. This is important. Safety  Wear your seat belt at all times when driving.  Make  a list of emergency phone numbers, including numbers for family, friends, the hospital, and police and fire departments. General instructions  Avoid cat litter boxes and soil used by cats. These carry germs that can cause birth defects in the baby. If you have a cat, ask someone to clean the litter box for you.  Do not travel far distances unless it is absolutely necessary and only with the approval of your health care provider.  Do not use hot tubs, steam rooms, or saunas.  Do not drink alcohol.  Do not use any products that contain nicotine or tobacco, such as cigarettes and e-cigarettes. If you need help quitting, ask your health care provider.  Do not use any medicinal herbs or unprescribed drugs. These chemicals affect the formation and growth of the baby.  Do not douche or use tampons or scented sanitary pads.  Do not cross your legs for long periods of time.  To prepare for the arrival of your baby: ? Take prenatal classes to understand, practice, and ask questions about labor and delivery. ? Make a trial run to the hospital. ? Visit the hospital and tour the maternity area. ? Arrange for maternity or paternity leave through employers. ? Arrange for family and friends to take care of pets while you are in the hospital. ? Purchase a rear-facing car seat and make sure you know how to install it in your car. ? Pack your hospital bag. ? Prepare the baby's nursery. Make sure to remove all pillows and stuffed animals from the baby's crib to prevent suffocation.  Visit your dentist if you have not gone during your pregnancy. Use a soft toothbrush to brush your teeth and be gentle when you floss. Contact a health care provider if:  You are unsure if you are in labor or if your water has broken.  You become dizzy.  You have mild pelvic cramps, pelvic pressure, or nagging pain in your abdominal area.  You have lower back pain.  You have persistent nausea, vomiting, or  diarrhea.  You have an unusual or bad smelling vaginal discharge.  You have pain when you urinate. Get help right away if:  Your water breaks before 37 weeks.  You have regular contractions less than 5 minutes apart before 37 weeks.  You have a fever.  You are leaking fluid from your vagina.  You have spotting or bleeding from your vagina.  You have severe abdominal pain or cramping.  You have rapid weight loss or weight gain.  You have   shortness of breath with chest pain.  You notice sudden or extreme swelling of your face, hands, ankles, feet, or legs.  Your baby makes fewer than 10 movements in 2 hours.  You have severe headaches that do not go away when you take medicine.  You have vision changes. Summary  The third trimester is from week 28 through week 40, months 7 through 9. The third trimester is a time when the unborn baby (fetus) is growing rapidly.  During the third trimester, your discomfort may increase as you and your baby continue to gain weight. You may have abdominal, leg, and back pain, sleeping problems, and an increased need to urinate.  During the third trimester your breasts will keep growing and they will continue to become tender. A yellow fluid (colostrum) may leak from your breasts. This is the first milk you are producing for your baby.  False labor is a condition in which you feel small, irregular tightenings of the muscles in the womb (contractions) that eventually go away. These are called Braxton Hicks contractions. Contractions may last for hours, days, or even weeks before true labor sets in.  Signs of labor can include: abdominal cramps; regular contractions that start at 10 minutes apart and become stronger and more frequent with time; watery or bloody mucus discharge that comes from the vagina; increased pelvic pressure and dull back pain; and leaking of amniotic fluid. This information is not intended to replace advice given to you by your  health care provider. Make sure you discuss any questions you have with your health care provider. Document Revised: 01/31/2019 Document Reviewed: 11/15/2016 Elsevier Patient Education  2020 [employer]  

## 2020-02-24 NOTE — Patient Instructions (Signed)
Your procedure is scheduled QI:[id number] Mar 05, 2020 Report to Day Surgery. To find out your arrival time please call [phone] between 1PM - 3PM on Wednesday Mar 04, 2020.  Remember: Instructions that are not followed completely may result in serious medical risk,  up to and including death, or upon the discretion of your surgeon and anesthesiologist your  surgery may need to be rescheduled.     _X__ 1. Do not eat food after midnight the night before your procedure.                 No gum chewing or hard candies. You may drink clear liquids up to 2 hours                 before you are scheduled to arrive for your surgery- DO not drink clear                 liquids within 2 hours of the start of your surgery.                 Clear Liquids include:  water or Gatorade Zero (avoid Red/Purple/Blue),                  Black Coffee or Tea (Do not add anything to coffee or tea).  __X__2.  On the morning of surgery brush your teeth with toothpaste and water, you                may rinse your mouth with mouthwash if you wish.  Do not swallow any toothpaste of mouthwash.     _X__ 3.  No Alcohol for 24 hours before or after surgery.   _X__ 4.  Do Not Smoke or use e-cigarettes For 24 Hours Prior to Your Surgery.                 Do not use any chewable tobacco products for at least 6 hours prior to                 Surgery.  _X__  5.  Do not use any recreational drugs (marijuana, cocaine, heroin, ecstacy, MDMA or other)                For at least one week prior to your surgery.  Combination of these drugs with anesthesia                May have life threatening results.  __X__ 6.  Notify your doctor if there is any change in your medical condition      (cold, fever, infections).     Do not wear jewelry, make-up, hairpins, clips or nail polish. Do not wear lotions, powders, or perfumes. You may wear deodorant. Do not shave 48 hours prior to surgery. Men may shave face  and neck. Do not bring valuables to the hospital.    [facility] is not responsible for any belongings or valuables.  Contacts, dentures or bridgework may not be worn into surgery. Leave your suitcase in the car. After surgery it may be brought to your room. For patients admitted to the hospital, discharge time is determined by your treatment team.   Patients discharged the day of surgery will not be allowed to drive home.   Make arrangements for someone to be with you for the first 24 hours of your Same Day Discharge.    __X__ Take these medicines the morning of surgery with A SIP OF WATER:  1. labetalol (NORMODYNE) 100 MG    __X__ Use CHG wipes as directed  __X__ Stop metformin 2 days prior to surgery (take last dose Monday Mar 02, 2020)    __X__ Take 1/2 of usual insulin dose the night before surgery. No insulin the morning          of surgery.   __X__ Stop Anti-inflammatories such as ibuprofen, Aleve, naproxen aspirin and pr BC powders.   __X__ Stop supplements until after surgery.    __X__ Do not add any herbal supplements before your surgery.

## 2020-02-25 LAB — CERVICOVAGINAL ANCILLARY ONLY
Chlamydia: NEGATIVE
Comment: NEGATIVE
Comment: NORMAL
Neisseria Gonorrhea: NEGATIVE

## 2020-02-25 NOTE — Progress Notes (Signed)
HROB G3 P2002 T2DM on insulin/ CHTN  at 34wk 6d presents for a NST. Currently on Lantus 26u at HS and Humalog 06/01/11 with meals and labetalol 100 mgm BID Baby active. Will be bottle feeding. Signed papers for TL 01/13/20. Repeat CS scheduled for 03/05/2020 Has appiontment with MFM in 1 week for growth scan and to review CBGs  NST: baseline 145 with accelerations to 160, moderate variability BP 102/66, negative proteinuria  A: IUP at 34wk 6d with T2DM controlled on insulin Reactive NST  P: RTO on 4/29 or 4/30 for repeat NST and AFI Has preop appointment with Dr Jerene Pitch on 02/19/2020. Continue current insulin dosage  Farrel Conners, CNM

## 2020-02-27 ENCOUNTER — Ambulatory Visit: Payer: Medicaid Other

## 2020-02-27 ENCOUNTER — Encounter: Payer: Self-pay | Admitting: Obstetrics & Gynecology

## 2020-02-27 ENCOUNTER — Other Ambulatory Visit: Payer: Self-pay

## 2020-02-27 ENCOUNTER — Ambulatory Visit (INDEPENDENT_AMBULATORY_CARE_PROVIDER_SITE_OTHER): Payer: Medicaid Other | Admitting: Obstetrics & Gynecology

## 2020-02-27 VITALS — BP 120/80 | Wt 162.0 lb

## 2020-02-27 DIAGNOSIS — E1165 Type 2 diabetes mellitus with hyperglycemia: Secondary | ICD-10-CM | POA: Diagnosis not present

## 2020-02-27 DIAGNOSIS — O34219 Maternal care for unspecified type scar from previous cesarean delivery: Secondary | ICD-10-CM

## 2020-02-27 DIAGNOSIS — Z3A36 36 weeks gestation of pregnancy: Secondary | ICD-10-CM

## 2020-02-27 DIAGNOSIS — O0990 Supervision of high risk pregnancy, unspecified, unspecified trimester: Secondary | ICD-10-CM

## 2020-02-27 LAB — FETAL NONSTRESS TEST

## 2020-02-27 MED ORDER — LANTUS SOLOSTAR 100 UNIT/ML ~~LOC~~ SOPN: 26.0000 [IU] | PEN_INJECTOR | Freq: Every day | SUBCUTANEOUS | 1 refills | Status: DC

## 2020-02-27 MED ORDER — METFORMIN HCL 1000 MG PO TABS: 1000.0000 mg | ORAL_TABLET | Freq: Two times a day (BID) | ORAL | 1 refills | Status: DC

## 2020-02-27 NOTE — Progress Notes (Signed)
Subjective  Fetal Movement? yes Contractions? no Leaking Fluid? no Vaginal Bleeding? no  Objective  BP 120/80   Wt 162 lb (73.5 kg)   LMP 06/18/2019 (Exact Date)   BMI 31.64 kg/m  General: NAD Pumonary: no increased work of breathing Abdomen: gravid, non-tender Extremities: no edema Psychiatric: mood appropriate, affect full  A NST procedure was performed with FHR monitoring and a normal baseline established, appropriate time of 20-40 minutes of evaluation, and accels >2 seen w 15x15 characteristics.  Results show a REACTIVE NST.   Assessment  34 y.o. [id number] at [redacted]w[redacted]d by  03/24/2020, by Last Menstrual Period presenting for routine prenatal visit  Plan   Problem List Items Addressed This Visit      Endocrine   Type 2 diabetes mellitus with hyperglycemia, without long-term current use of insulin (HCC) - Primary   Relevant Medications   metFORMIN (GLUCOPHAGE) 1000 MG tablet   insulin glargine (LANTUS SOLOSTAR) 100 UNIT/ML Solostar Pen     Other   Supervision of high risk pregnancy, antepartum   Previous cesarean delivery affecting pregnancy, antepartum    Other Visit Diagnoses    [redacted] weeks gestation of pregnancy          pregnancy Problems (from 08/01/19 to present)    Problem Noted Resolved   Chronic hypertension affecting pregnancy 08/13/2019 by Farrel Conners, CNM No   Overview Addendum 11/20/2019 10:35 AM by [name], MD    [x ] Aspirin 81 mg daily after 12 weeks; discontinue after 36 weeks [x ] baseline labs with CBC, CMP, urine protein/creatinine ratio [ ]  no BP meds unless BPs become elevated Labetalol started, 100 BID as of 12/1 [ ]  ultrasound for growth at 28, 32, 36 weeks [ ]  Baseline EKG   Current antihypertensives:  Labetalol 200 mgm BID (Losartan and HCTZ were discontinued)  Baseline and surveillance labs (pulled in from [facility], refresh links as needed)  Lab Results  Component Value Date   PLT 388 08/01/2019   CREATININE 0.56 (L) 08/07/2019    AST 16 08/07/2019   ALT 10 08/07/2019    Antenatal Testing CHTN - O10.919  Group I  BP < 140/90, no preeclampsia, AGA,  nml AFV, +/- meds    Group II BP > 140/90, on meds, no preeclampsia, AGA, nml AFV  20-28-34-38  20-24-28-32-35-38  32//2 x wk  28//BPP wkly then 32//2 x wk  40 no meds; 39 meds  PRN or 37  Pre-eclampsia  GHTN - O13.9/Preeclampsia without severe features  - O14.00   Preeclampsia with severe features - O14.10  Q 3-4wks  Q 2 wks  28//BPP wkly then 32//2 x wk  Inpatient  37  PRN or 34   UPDATED PLAN: (1/27) MonthlyUSgrowth surveillance beginning at 28 weeksfollowed by weekly antenatal testing beginning at 32 weeks and twice weekly testing beginning at 34 weekswith delivery at 37-39 weeks depending on maternal and fetal status.      Previous cesarean delivery affecting pregnancy, antepartum 08/13/2019 by 07-22-1989, CNM No   Overview Addendum 02/06/2020  8:45 AM by 08/15/2019, MD    Hx of 2 Cesarean sections Plan CS given IDDM and HTN would perform at 37-39 weeks  Desires BTL as well      Pre-existing type 2 diabetes mellitus during pregnancy, antepartum 08/13/2019 by 02/08/2020, CNM No   Overview Addendum 01/30/2020  8:28 AM by 08/15/2019, MD    Current Diabetic Medications:  Lantus Insulin,  Metformin    Long-acting   *  Lantus 26 units humalog Humalog 8 u tid    * Metformin [x ] Aspirin 81 mg daily after 12 weeks; discontinue after 36 weeks (? A2/B GDM)    For A2/B GDM or higher classes of DM [x ] Diabetes Education and Testing Supplies [x ] Nutrition Counsult [x ] Fetal ECHO after 22-24 weeks (fetal echo normal on 1/21)  [x ] Eye exam for retina evaluation  [x ] Baseline EKG [ x] US fetal growth every 4 weeks starting at 28 weeks [x ] Twice weekly NST starting at [redacted] weeks gestation [x ] Delivery planning contingent on fetal growth, AFI, glycemic control, and other co-morbidities but at least by 39  weeks  Baseline and surveillance labs (pulled in from [facility], refresh links as needed)  Lab Results  Component Value Date   CREATININE 0.56 (L) 08/07/2019   AST 16 08/07/2019   ALT 10 08/07/2019   TSH 0.841 09/26/2018   Lab Results  Component Value Date   HGBA1C 7.7 (H) 08/07/2019   HGBA1C 7.7 (H) 05/29/2019   HGBA1C 8.8 (H) 04/10/2019    Antenatal Testing Class of DM U/S NST/AFI DELIVERY  Diabetes   A1 - good control - O24.410    A2 - good control - O24.419      A2  - poor control or poor compliance - O24.419, E11.65   (Macrosomia or polyhydramnios) **E11.65 is extra code for poor control**    A2/B - O24.919  and B-C O24.319  Poor control B-C or D-R-F-T - O24.319  or  Type I DM - O24.019  20-38  20-38  20-24-28-32-36   20-24-28-32-35-38//fetal echo  20-24-27-30-33-36-38//fetal echo  40  32//2 x wk  32//2 x wk   32//2 x wk  28//BPP wkly then 32//2 x wk  40  39  PRN   39  PRN   Has Bionime glucometer 10/14 hemoglobin A1c was 7.7%      Supervision of high risk pregnancy, antepartum 08/01/2019 by Rod Can, CNM No   Overview Addendum 02/09/2020  6:35 PM by [name], [place] Prenatal Labs  Dating [place] Blood type: O/Positive/-- (10/08 1549) O POSITIVE  Genetic Screen declines Antibody:Negative (10/08 1549)negative  Anatomic [place] DP MFM Rubella: 3.99 (10/08 1549) Immune Varicella: Immue  GTT Type 2 DM RPR: Non Reactive (10/08 1549) negative  Rhogam n/a HBsAg: Negative (10/08 1549) negative  Vaccines TDAP:  01/20/20                     Flu Shot: HIV: Non Reactive (10/08 1549) non reactive  Baby Food Formula                              GBS:   Contraception BTL, consent 30 wks [x]  01/13/2020 Pap: at [facility] June 2020, neg per pt report  CBB  No Hemoglobin A1C 7.7% on 10/14  CS/VBAC 2006 primary for FTP 8#, 2009 repeat 9#10oz Repeat scheduled for 03/05/2020   Support Person Partner Sonia Side- 6'9" tall             NST R today    BPP next week, CS next week   Monitor for s/sx labor   BS log.  Doing well. Cont Metformin and Insulin  Barnett Applebaum, MD, [facility] 02/27/2020  10:02 AM

## 2020-02-28 LAB — CULTURE, BETA STREP (GROUP B ONLY): Strep Gp B Culture: NEGATIVE

## 2020-03-02 ENCOUNTER — Other Ambulatory Visit: Payer: Self-pay | Admitting: Obstetrics and Gynecology

## 2020-03-02 ENCOUNTER — Ambulatory Visit: Payer: Medicaid Other

## 2020-03-02 ENCOUNTER — Other Ambulatory Visit: Payer: Self-pay

## 2020-03-02 ENCOUNTER — Ambulatory Visit
Admission: RE | Admit: 2020-03-02 | Discharge: 2020-03-02 | Disposition: A | Discharge status: Home / Self Care | Payer: Medicaid Other | Source: Ambulatory Visit | Attending: Maternal & Fetal Medicine | Admitting: Maternal & Fetal Medicine

## 2020-03-02 DIAGNOSIS — Z794 Long term (current) use of insulin: Secondary | ICD-10-CM | POA: Diagnosis not present

## 2020-03-02 DIAGNOSIS — E1165 Type 2 diabetes mellitus with hyperglycemia: Secondary | ICD-10-CM | POA: Insufficient documentation

## 2020-03-02 DIAGNOSIS — O24119 Pre-existing diabetes mellitus, type 2, in pregnancy, unspecified trimester: Secondary | ICD-10-CM

## 2020-03-02 DIAGNOSIS — Z3A36 36 weeks gestation of pregnancy: Secondary | ICD-10-CM | POA: Diagnosis not present

## 2020-03-02 DIAGNOSIS — O24311 Unspecified pre-existing diabetes mellitus in pregnancy, first trimester: Secondary | ICD-10-CM | POA: Insufficient documentation

## 2020-03-02 DIAGNOSIS — O163 Unspecified maternal hypertension, third trimester: Secondary | ICD-10-CM | POA: Insufficient documentation

## 2020-03-02 DIAGNOSIS — O0990 Supervision of high risk pregnancy, unspecified, unspecified trimester: Secondary | ICD-10-CM

## 2020-03-02 DIAGNOSIS — I10 Essential (primary) hypertension: Secondary | ICD-10-CM

## 2020-03-02 DIAGNOSIS — O24113 Pre-existing diabetes mellitus, type 2, in pregnancy, third trimester: Secondary | ICD-10-CM | POA: Diagnosis present

## 2020-03-02 DIAGNOSIS — O34219 Maternal care for unspecified type scar from previous cesarean delivery: Secondary | ICD-10-CM

## 2020-03-02 DIAGNOSIS — O10919 Unspecified pre-existing hypertension complicating pregnancy, unspecified trimester: Secondary | ICD-10-CM

## 2020-03-02 LAB — GLUCOSE, CAPILLARY: Glucose-Capillary: 132 mg/dL — ABNORMAL HIGH (ref 70–99)

## 2020-03-03 ENCOUNTER — Other Ambulatory Visit
Admission: RE | Admit: 2020-03-03 | Discharge: 2020-03-03 | Disposition: A | Discharge status: Home / Self Care | Payer: Medicaid Other | Source: Ambulatory Visit | Attending: Obstetrics and Gynecology | Admitting: Obstetrics and Gynecology

## 2020-03-03 DIAGNOSIS — Z01812 Encounter for preprocedural laboratory examination: Secondary | ICD-10-CM | POA: Diagnosis present

## 2020-03-03 DIAGNOSIS — Z20822 Contact with and (suspected) exposure to covid-19: Secondary | ICD-10-CM | POA: Diagnosis not present

## 2020-03-03 LAB — SARS CORONAVIRUS 2 (TAT 6-24 HRS): SARS Coronavirus 2: NEGATIVE

## 2020-03-03 LAB — TYPE AND SCREEN
ABO/RH(D): O POS
Antibody Screen: NEGATIVE
Extend sample reason: UNDETERMINED

## 2020-03-04 ENCOUNTER — Encounter: Payer: Self-pay | Admitting: Obstetrics and Gynecology

## 2020-03-05 ENCOUNTER — Inpatient Hospital Stay: Payer: Medicaid Other | Admitting: Anesthesiology

## 2020-03-05 ENCOUNTER — Encounter
Admission: RE | Disposition: A | Discharge status: Home / Self Care | Payer: Self-pay | Source: Home / Self Care | Attending: Obstetrics and Gynecology

## 2020-03-05 ENCOUNTER — Encounter: Payer: Self-pay | Admitting: Obstetrics and Gynecology

## 2020-03-05 ENCOUNTER — Inpatient Hospital Stay
Admission: RE | Admit: 2020-03-05 | Discharge: 2020-03-07 | DRG: 783 | Disposition: A | Discharge status: Home / Self Care | Payer: Medicaid Other | Attending: Obstetrics and Gynecology | Admitting: Obstetrics and Gynecology

## 2020-03-05 ENCOUNTER — Other Ambulatory Visit: Payer: Self-pay

## 2020-03-05 DIAGNOSIS — Z794 Long term (current) use of insulin: Secondary | ICD-10-CM

## 2020-03-05 DIAGNOSIS — O2412 Pre-existing diabetes mellitus, type 2, in childbirth: Secondary | ICD-10-CM | POA: Diagnosis present

## 2020-03-05 DIAGNOSIS — Z98891 History of uterine scar from previous surgery: Secondary | ICD-10-CM

## 2020-03-05 DIAGNOSIS — O1002 Pre-existing essential hypertension complicating childbirth: Secondary | ICD-10-CM | POA: Diagnosis present

## 2020-03-05 DIAGNOSIS — E119 Type 2 diabetes mellitus without complications: Secondary | ICD-10-CM | POA: Diagnosis present

## 2020-03-05 DIAGNOSIS — O34219 Maternal care for unspecified type scar from previous cesarean delivery: Secondary | ICD-10-CM | POA: Diagnosis not present

## 2020-03-05 DIAGNOSIS — O9081 Anemia of the puerperium: Secondary | ICD-10-CM | POA: Diagnosis not present

## 2020-03-05 DIAGNOSIS — O10013 Pre-existing essential hypertension complicating pregnancy, third trimester: Secondary | ICD-10-CM

## 2020-03-05 DIAGNOSIS — O0993 Supervision of high risk pregnancy, unspecified, third trimester: Secondary | ICD-10-CM

## 2020-03-05 DIAGNOSIS — Z302 Encounter for sterilization: Secondary | ICD-10-CM | POA: Diagnosis not present

## 2020-03-05 DIAGNOSIS — O24113 Pre-existing diabetes mellitus, type 2, in pregnancy, third trimester: Secondary | ICD-10-CM | POA: Diagnosis not present

## 2020-03-05 DIAGNOSIS — Z3A37 37 weeks gestation of pregnancy: Secondary | ICD-10-CM

## 2020-03-05 DIAGNOSIS — D62 Acute posthemorrhagic anemia: Secondary | ICD-10-CM | POA: Diagnosis not present

## 2020-03-05 DIAGNOSIS — Z3A35 35 weeks gestation of pregnancy: Secondary | ICD-10-CM | POA: Diagnosis not present

## 2020-03-05 DIAGNOSIS — O34211 Maternal care for low transverse scar from previous cesarean delivery: Principal | ICD-10-CM | POA: Diagnosis present

## 2020-03-05 LAB — CBC
HCT: 30.3 % — ABNORMAL LOW (ref 36.0–46.0)
Hemoglobin: 9.5 g/dL — ABNORMAL LOW (ref 12.0–15.0)
MCH: 27.6 pg (ref 26.0–34.0)
MCHC: 31.4 g/dL (ref 30.0–36.0)
MCV: 88.1 fL (ref 80.0–100.0)
Platelets: 292 10*3/uL (ref 150–400)
RBC: 3.44 MIL/uL — ABNORMAL LOW (ref 3.87–5.11)
RDW: 13.6 % (ref 11.5–15.5)
WBC: 12 10*3/uL — ABNORMAL HIGH (ref 4.0–10.5)
nRBC: 0 % (ref 0.0–0.2)

## 2020-03-05 LAB — GLUCOSE, CAPILLARY
Glucose-Capillary: 126 mg/dL — ABNORMAL HIGH (ref 70–99)
Glucose-Capillary: 141 mg/dL — ABNORMAL HIGH (ref 70–99)
Glucose-Capillary: 152 mg/dL — ABNORMAL HIGH (ref 70–99)
Glucose-Capillary: 131 mg/dL — ABNORMAL HIGH (ref 70–99)
Glucose-Capillary: 197 mg/dL — ABNORMAL HIGH (ref 70–99)

## 2020-03-05 LAB — ABO/RH: ABO/RH(D): O POS

## 2020-03-05 SURGERY — Surgical Case
Anesthesia: Spinal

## 2020-03-05 MED ORDER — PHENYLEPHRINE HCL (PRESSORS) 10 MG/ML IV SOLN
INTRAVENOUS | Status: AC
  Filled 2020-03-05: qty 1

## 2020-03-05 MED ORDER — NALBUPHINE HCL 10 MG/ML IJ SOLN: 5.0000 mg | Freq: Once | INTRAMUSCULAR | Status: DC | PRN

## 2020-03-05 MED ORDER — BUPIVACAINE ON-Q PAIN PUMP (FOR ORDER SET NO CHG)
INJECTION | Status: DC
  Filled 2020-03-05: qty 1

## 2020-03-05 MED ORDER — FLEET ENEMA 7-19 GM/118ML RE ENEM: 1.0000 | ENEMA | Freq: Every day | RECTAL | Status: DC | PRN

## 2020-03-05 MED ORDER — WITCH HAZEL-GLYCERIN EX PADS: 1.0000 "application " | MEDICATED_PAD | CUTANEOUS | Status: DC | PRN

## 2020-03-05 MED ORDER — BUPIVACAINE 0.25 % ON-Q PUMP DUAL CATH 400 ML
400.0000 mL | INJECTION | Status: DC
  Filled 2020-03-05 (×2): qty 400

## 2020-03-05 MED ORDER — FENTANYL CITRATE (PF) 100 MCG/2ML IJ SOLN
INTRAMUSCULAR | Status: DC | PRN
  Administered 2020-03-05: 15 ug via INTRATHECAL

## 2020-03-05 MED ORDER — BUPIVACAINE HCL (PF) 0.5 % IJ SOLN
INTRAMUSCULAR | Status: AC
  Filled 2020-03-05: qty 30

## 2020-03-05 MED ORDER — SODIUM CHLORIDE 0.9% FLUSH: 3.0000 mL | INTRAVENOUS | Status: DC | PRN

## 2020-03-05 MED ORDER — OXYTOCIN 40 UNITS IN NORMAL SALINE INFUSION - SIMPLE MED
INTRAVENOUS | Status: DC | PRN
  Administered 2020-03-05: 300 mL via INTRAVENOUS

## 2020-03-05 MED ORDER — SOD CITRATE-CITRIC ACID 500-334 MG/5ML PO SOLN
30.0000 mL | ORAL | Status: AC
  Administered 2020-03-05: 30 mL via ORAL
  Filled 2020-03-05: qty 30

## 2020-03-05 MED ORDER — SENNOSIDES-DOCUSATE SODIUM 8.6-50 MG PO TABS
2.0000 | ORAL_TABLET | ORAL | Status: DC
  Administered 2020-03-06 – 2020-03-07 (×2): 2 via ORAL
  Filled 2020-03-05 (×2): qty 2

## 2020-03-05 MED ORDER — OXYTOCIN 40 UNITS IN NORMAL SALINE INFUSION - SIMPLE MED
2.5000 [IU]/h | INTRAVENOUS | Status: AC
  Administered 2020-03-05: 2.5 [IU]/h via INTRAVENOUS
  Filled 2020-03-05: qty 1000

## 2020-03-05 MED ORDER — BISACODYL 10 MG RE SUPP: 10.0000 mg | Freq: Every day | RECTAL | Status: DC | PRN

## 2020-03-05 MED ORDER — SIMETHICONE 80 MG PO CHEW
80.0000 mg | CHEWABLE_TABLET | Freq: Three times a day (TID) | ORAL | Status: DC
  Administered 2020-03-05 – 2020-03-07 (×6): 80 mg via ORAL
  Filled 2020-03-05 (×6): qty 1

## 2020-03-05 MED ORDER — INSULIN GLARGINE 100 UNIT/ML ~~LOC~~ SOLN
10.0000 [IU] | Freq: Every day | SUBCUTANEOUS | Status: DC
  Administered 2020-03-05 – 2020-03-06 (×2): 10 [IU] via SUBCUTANEOUS
  Filled 2020-03-05 (×4): qty 0.1

## 2020-03-05 MED ORDER — OXYTOCIN 40 UNITS IN NORMAL SALINE INFUSION - SIMPLE MED
INTRAVENOUS | Status: AC
  Filled 2020-03-05: qty 1000

## 2020-03-05 MED ORDER — MEPERIDINE HCL 25 MG/ML IJ SOLN: 6.2500 mg | INTRAMUSCULAR | Status: DC | PRN

## 2020-03-05 MED ORDER — FERROUS SULFATE 325 (65 FE) MG PO TABS
325.0000 mg | ORAL_TABLET | Freq: Two times a day (BID) | ORAL | Status: DC
  Administered 2020-03-05 – 2020-03-07 (×4): 325 mg via ORAL
  Filled 2020-03-05 (×4): qty 1

## 2020-03-05 MED ORDER — MORPHINE SULFATE (PF) 0.5 MG/ML IJ SOLN
INTRAMUSCULAR | Status: DC | PRN
  Administered 2020-03-05: 100 mg via INTRATHECAL

## 2020-03-05 MED ORDER — BUPIVACAINE HCL (PF) 0.5 % IJ SOLN
INTRAMUSCULAR | Status: DC | PRN
  Administered 2020-03-05: 10 mL

## 2020-03-05 MED ORDER — ZOLPIDEM TARTRATE 5 MG PO TABS: 5.0000 mg | ORAL_TABLET | Freq: Every evening | ORAL | Status: DC | PRN

## 2020-03-05 MED ORDER — IBUPROFEN 800 MG PO TABS
800.0000 mg | ORAL_TABLET | Freq: Three times a day (TID) | ORAL | Status: DC
  Administered 2020-03-06 – 2020-03-07 (×4): 800 mg via ORAL
  Filled 2020-03-05 (×4): qty 1

## 2020-03-05 MED ORDER — ACETAMINOPHEN 500 MG PO TABS
1000.0000 mg | ORAL_TABLET | Freq: Four times a day (QID) | ORAL | Status: DC
  Administered 2020-03-05 – 2020-03-07 (×7): 1000 mg via ORAL
  Filled 2020-03-05 (×8): qty 2

## 2020-03-05 MED ORDER — OXYCODONE HCL 5 MG PO TABS: 5.0000 mg | ORAL_TABLET | Freq: Four times a day (QID) | ORAL | Status: AC | PRN

## 2020-03-05 MED ORDER — DIPHENHYDRAMINE HCL 25 MG PO CAPS: 25.0000 mg | ORAL_CAPSULE | Freq: Four times a day (QID) | ORAL | Status: DC | PRN

## 2020-03-05 MED ORDER — NALOXONE HCL 4 MG/10ML IJ SOLN
1.0000 ug/kg/h | INTRAVENOUS | Status: DC | PRN
  Filled 2020-03-05: qty 5

## 2020-03-05 MED ORDER — INSULIN ASPART 100 UNIT/ML ~~LOC~~ SOLN
2.0000 [IU] | Freq: Once | SUBCUTANEOUS | Status: AC
  Administered 2020-03-05: 2 [IU] via SUBCUTANEOUS

## 2020-03-05 MED ORDER — LACTATED RINGERS IV SOLN
INTRAVENOUS | Status: DC | PRN
Start: 2020-03-05 — End: 2020-03-05

## 2020-03-05 MED ORDER — ONDANSETRON HCL 4 MG/2ML IJ SOLN
INTRAMUSCULAR | Status: DC | PRN
  Administered 2020-03-05: 4 mg via INTRAVENOUS

## 2020-03-05 MED ORDER — PRENATAL MULTIVITAMIN CH
1.0000 | ORAL_TABLET | Freq: Every day | ORAL | Status: DC
  Administered 2020-03-05 – 2020-03-07 (×3): 1 via ORAL
  Filled 2020-03-05 (×3): qty 1

## 2020-03-05 MED ORDER — METFORMIN HCL 500 MG PO TABS
1000.0000 mg | ORAL_TABLET | Freq: Two times a day (BID) | ORAL | Status: DC
  Administered 2020-03-05 – 2020-03-07 (×4): 1000 mg via ORAL
  Filled 2020-03-05 (×5): qty 2

## 2020-03-05 MED ORDER — ACETAMINOPHEN 325 MG PO TABS: 650.0000 mg | ORAL_TABLET | Freq: Four times a day (QID) | ORAL | Status: DC

## 2020-03-05 MED ORDER — OXYCODONE HCL 5 MG/5ML PO SOLN
5.0000 mg | Freq: Once | ORAL | Status: DC | PRN
  Filled 2020-03-05: qty 5

## 2020-03-05 MED ORDER — NALOXONE HCL 0.4 MG/ML IJ SOLN: 0.4000 mg | INTRAMUSCULAR | Status: DC | PRN

## 2020-03-05 MED ORDER — FAMOTIDINE 20 MG PO TABS
20.0000 mg | ORAL_TABLET | Freq: Once | ORAL | Status: AC
  Administered 2020-03-05: 20 mg via ORAL
  Filled 2020-03-05: qty 1

## 2020-03-05 MED ORDER — DIBUCAINE (PERIANAL) 1 % EX OINT: 1.0000 "application " | TOPICAL_OINTMENT | CUTANEOUS | Status: DC | PRN

## 2020-03-05 MED ORDER — SIMETHICONE 80 MG PO CHEW: 80.0000 mg | CHEWABLE_TABLET | ORAL | Status: DC

## 2020-03-05 MED ORDER — ONDANSETRON HCL 4 MG/2ML IJ SOLN: 4.0000 mg | Freq: Three times a day (TID) | INTRAMUSCULAR | Status: DC | PRN

## 2020-03-05 MED ORDER — DIPHENHYDRAMINE HCL 50 MG/ML IJ SOLN: 12.5000 mg | INTRAMUSCULAR | Status: DC | PRN

## 2020-03-05 MED ORDER — DEXAMETHASONE SODIUM PHOSPHATE 10 MG/ML IJ SOLN
INTRAMUSCULAR | Status: AC
  Filled 2020-03-05: qty 1

## 2020-03-05 MED ORDER — COCONUT OIL OIL: 1.0000 "application " | TOPICAL_OIL | Status: DC | PRN

## 2020-03-05 MED ORDER — CEFAZOLIN SODIUM-DEXTROSE 2-4 GM/100ML-% IV SOLN
2.0000 g | INTRAVENOUS | Status: AC
  Administered 2020-03-05: 2 g via INTRAVENOUS
  Filled 2020-03-05: qty 100

## 2020-03-05 MED ORDER — INSULIN ASPART 100 UNIT/ML ~~LOC~~ SOLN
4.0000 [IU] | Freq: Three times a day (TID) | SUBCUTANEOUS | Status: DC
  Administered 2020-03-05 (×2): 4 [IU] via SUBCUTANEOUS
  Filled 2020-03-05 (×3): qty 1

## 2020-03-05 MED ORDER — ONDANSETRON HCL 4 MG/2ML IJ SOLN
INTRAMUSCULAR | Status: AC
  Filled 2020-03-05: qty 2

## 2020-03-05 MED ORDER — INSULIN ASPART 100 UNIT/ML ~~LOC~~ SOLN
SUBCUTANEOUS | Status: AC
  Filled 2020-03-05: qty 1

## 2020-03-05 MED ORDER — HYDROMORPHONE HCL 1 MG/ML IJ SOLN: 0.2000 mg | INTRAMUSCULAR | Status: DC | PRN

## 2020-03-05 MED ORDER — DIPHENHYDRAMINE HCL 25 MG PO CAPS: 25.0000 mg | ORAL_CAPSULE | ORAL | Status: DC | PRN

## 2020-03-05 MED ORDER — SODIUM CHLORIDE 0.9 % IV SOLN: INTRAVENOUS | Status: DC

## 2020-03-05 MED ORDER — PHENYLEPHRINE HCL (PRESSORS) 10 MG/ML IV SOLN
INTRAVENOUS | Status: AC
  Filled 2020-03-05: qty 3

## 2020-03-05 MED ORDER — OXYCODONE HCL 5 MG PO TABS: 5.0000 mg | ORAL_TABLET | ORAL | Status: DC | PRN

## 2020-03-05 MED ORDER — MORPHINE SULFATE (PF) 0.5 MG/ML IJ SOLN
INTRAMUSCULAR | Status: AC
  Filled 2020-03-05: qty 10

## 2020-03-05 MED ORDER — NALBUPHINE HCL 10 MG/ML IJ SOLN: 5.0000 mg | INTRAMUSCULAR | Status: DC | PRN

## 2020-03-05 MED ORDER — PRENATAL MULTIVITAMIN CH: 1.0000 | ORAL_TABLET | Freq: Every day | ORAL | Status: DC

## 2020-03-05 MED ORDER — LABETALOL HCL 200 MG PO TABS
200.0000 mg | ORAL_TABLET | Freq: Two times a day (BID) | ORAL | Status: DC
  Administered 2020-03-05: 200 mg via ORAL
  Filled 2020-03-05: qty 1

## 2020-03-05 MED ORDER — BUPIVACAINE IN DEXTROSE 0.75-8.25 % IT SOLN
INTRATHECAL | Status: DC | PRN
  Administered 2020-03-05: 1.5 mL via INTRATHECAL

## 2020-03-05 MED ORDER — OXYCODONE HCL 5 MG PO TABS: 5.0000 mg | ORAL_TABLET | Freq: Once | ORAL | Status: DC | PRN

## 2020-03-05 MED ORDER — FENTANYL CITRATE (PF) 100 MCG/2ML IJ SOLN
INTRAMUSCULAR | Status: AC
  Filled 2020-03-05: qty 2

## 2020-03-05 MED ORDER — DEXAMETHASONE SODIUM PHOSPHATE 10 MG/ML IJ SOLN
INTRAMUSCULAR | Status: DC | PRN
  Administered 2020-03-05: 10 mg via INTRAVENOUS

## 2020-03-05 MED ORDER — FENTANYL CITRATE (PF) 100 MCG/2ML IJ SOLN: 25.0000 ug | INTRAMUSCULAR | Status: DC | PRN

## 2020-03-05 MED ORDER — MENTHOL 3 MG MT LOZG
1.0000 | LOZENGE | OROMUCOSAL | Status: DC | PRN
  Filled 2020-03-05: qty 9

## 2020-03-05 MED ORDER — LACTATED RINGERS IV SOLN: INTRAVENOUS | Status: DC

## 2020-03-05 MED ORDER — SODIUM CHLORIDE 0.9 % IV SOLN
INTRAVENOUS | Status: DC | PRN
  Administered 2020-03-05: 50 ug/min via INTRAVENOUS

## 2020-03-05 MED ORDER — KETOROLAC TROMETHAMINE 30 MG/ML IJ SOLN: 30.0000 mg | Freq: Four times a day (QID) | INTRAMUSCULAR | Status: AC

## 2020-03-05 MED ORDER — PROPOFOL 10 MG/ML IV BOLUS
INTRAVENOUS | Status: AC
  Filled 2020-03-05: qty 20

## 2020-03-05 MED ORDER — SIMETHICONE 80 MG PO CHEW: 80.0000 mg | CHEWABLE_TABLET | ORAL | Status: DC | PRN

## 2020-03-05 MED ORDER — KETOROLAC TROMETHAMINE 30 MG/ML IJ SOLN
30.0000 mg | Freq: Four times a day (QID) | INTRAMUSCULAR | Status: AC
  Administered 2020-03-05 – 2020-03-06 (×3): 30 mg via INTRAVENOUS
  Filled 2020-03-05 (×4): qty 1

## 2020-03-05 SURGICAL SUPPLY — 28 items
CANISTER SUCT 3000ML PPV (MISCELLANEOUS) ×4 IMPLANT
CHLORAPREP W/TINT 26 (MISCELLANEOUS) ×8 IMPLANT
DERMABOND ADVANCED (GAUZE/BANDAGES/DRESSINGS) ×2
DERMABOND ADVANCED .7 DNX12 (GAUZE/BANDAGES/DRESSINGS) ×2 IMPLANT
DRSG OPSITE POSTOP 4X10 (GAUZE/BANDAGES/DRESSINGS) ×4 IMPLANT
ELECT CAUTERY BLADE 6.4 (BLADE) ×4 IMPLANT
ELECT REM PT RETURN 9FT ADLT (ELECTROSURGICAL) ×4
ELECTRODE REM PT RTRN 9FT ADLT (ELECTROSURGICAL) ×2 IMPLANT
EXTRACTOR VACUUM KIWI (MISCELLANEOUS) ×2 IMPLANT
GLOVE BIOGEL PI IND STRL 6.5 (GLOVE) ×4 IMPLANT
GLOVE BIOGEL PI INDICATOR 6.5 (GLOVE) ×4
GOWN STRL REUS W/ TWL LRG LVL3 (GOWN DISPOSABLE) ×2 IMPLANT
GOWN STRL REUS W/ TWL XL LVL3 (GOWN DISPOSABLE) ×4 IMPLANT
GOWN STRL REUS W/TWL LRG LVL3 (GOWN DISPOSABLE) ×2
GOWN STRL REUS W/TWL XL LVL3 (GOWN DISPOSABLE) ×4
NS IRRIG 1000ML POUR BTL (IV SOLUTION) ×4 IMPLANT
PACK C SECTION (MISCELLANEOUS) ×4 IMPLANT
PAD OB MATERNITY 4.3X12.25 (PERSONAL CARE ITEMS) ×4 IMPLANT
PAD PREP 24X41 OB/GYN DISP (PERSONAL CARE ITEMS) ×4 IMPLANT
PENCIL SMOKE ULTRAEVAC 22 CON (MISCELLANEOUS) ×4 IMPLANT
RETRACTOR TRAXI PANNICULUS (MISCELLANEOUS) IMPLANT
SUT CHROMIC 0 CT 1 (SUTURE) ×6 IMPLANT
SUT MNCRL AB 4-0 PS2 18 (SUTURE) ×4 IMPLANT
SUT PLAIN 3-0 (SUTURE) ×4 IMPLANT
SUT VIC AB 0 CT1 36 (SUTURE) ×12 IMPLANT
SUT VIC AB 2-0 CT1 36 (SUTURE) ×4 IMPLANT
SYR 30ML LL (SYRINGE) ×8 IMPLANT
TRAXI PANNICULUS RETRACTOR (MISCELLANEOUS) ×2

## 2020-03-05 NOTE — Transfer of Care (Signed)
Immediate Anesthesia Transfer of Care Note  Patient: Deanna Zimmerman  Procedure(s) Performed: CESAREAN SECTION WITH BILATERAL TUBAL LIGATION  Patient Location: Mother/Baby  Anesthesia Type:Spinal  Level of Consciousness: awake, alert  and oriented  Airway & Oxygen Therapy: Patient Spontanous Breathing  Post-op Assessment: Report given to RN and Post -op Vital signs reviewed and stable  Post vital signs: Reviewed  Last Vitals:  Vitals Value Taken Time  BP 122/78 03/05/20 0901  Temp 36.8 C 03/05/20 0901  Pulse 78 03/05/20 0901  Resp 16 03/05/20 0901  SpO2 99 % 03/05/20 0901    Last Pain:  Vitals:   03/05/20 0535  TempSrc: Oral         Complications: No apparent anesthesia complications

## 2020-03-05 NOTE — Progress Notes (Signed)
Spoke to [place], CNM about patient repeat CBG being 152. She states she will call me back with new orders.

## 2020-03-05 NOTE — Progress Notes (Signed)
Spoke to [place] CNM about patient CBG being 141. CNM states to recheck in 1-2 hours and let her know if it is elevated.

## 2020-03-05 NOTE — Anesthesia Procedure Notes (Signed)
Spinal  Patient location during procedure: OR Start time: 03/05/2020 7:42 AM End time: 03/05/2020 7:46 AM Staffing Performed: other anesthesia staff  Anesthesiologist: Piscitello, Precious Haws, MD Resident/CRNA: Rolla Plate, CRNA Other anesthesia staff: Norm Salt, RN Preanesthetic Checklist Completed: patient identified, IV checked, site marked, risks and benefits discussed, surgical consent, monitors and equipment checked and pre-op evaluation Spinal Block Patient position: sitting Prep: ChloraPrep and site prepped and draped Patient monitoring: heart rate, continuous pulse ox and blood pressure Approach: midline Location: L4-5 Injection technique: single-shot Needle Needle type: Introducer and Pencan  Needle gauge: 24 G Needle length: 9 cm Assessment Sensory level: T2 Additional Notes Negative paresthesia. Negative blood return. Positive free-flowing CSF. Expiration date of kit checked and confirmed. Patient tolerated procedure well, without complications.

## 2020-03-05 NOTE — Op Note (Addendum)
Cesarean Section Procedure Note 03/05/20  Pre-operative Diagnosis:  1. History of prior cesarean setion  2. Desire for sterilization  3. Type 2 Diabetes  4. Chronic HTN Post-operative Diagnosis: same, delivered. Procedure: Repeat Low Transverse Cesarean Section with Bilateral Tubal Ligation Surgeon: Adrian Prows MD   Assistant(s): Barnett Applebaum MD - No other skilled surgical assistant available. Anesthesia: Spinal Estimated Blood Loss: 025 cc Complications: None; patient tolerated the procedure well.  Disposition: PACU - hemodynamically stable. Condition: stable   Findings: A female infant in the cephalic presentation. Amniotic fluid - clear  Birth weight: 7 lbs 7oz Apgars of 9 and 9.  Intact placenta with a three-vessel cord. Grossly normal uterus, tubes and ovaries bilaterally. No intraabdominal adhesions were noted.   Procedure Details    The patient was taken to operating room, identified as the correct patient and the procedure verified as C-Section Delivery. A time out was held and the above information confirmed. After induction of anesthesia, the patient was draped and prepped in the usual sterile manner. A Pfannenstiel incision was made and carried down through the subcutaneous tissue to the fascia. Fascial incision was made and extended transversely with the Mayo scissors. The fascia was separated from the underlying rectus tissue superiorly and inferiorly. The peritoneum was identified and entered bluntly. Peritoneal incision was extended longitudinally. A low transverse hysterotomy was made. The fetus was delivered atraumatically with assistance of the KIWI vacuum. One pull to deliver the infant. The umbilical cord was clamped x2 and cut and the infant was handed to the awaiting pediatricians. The placenta was removed intact and appeared normal with a 3-vessel cord. The uterus was exteriorized and cleared of all clot and debris. The hysterotomy was closed with running  sutures of 0 Vicryl suture. A second imbricating layer was placed with the same suture. A single figure of eight suture o 0-chromic was placed in the midline for hemostasis.  Excellent hemostasis was observed. Desire for tubal ligation was again verbally confirmed with the patient. The right fallopian tube was followed from the cornua to the fimbria. The babcock clamp was used to grasp and elevate an avascular midsection of the tube approximately 3-4cm from the cornua. 0-chromic suture was used passed through the mesosalpinx and used to create a knuckle to fallopian tube. The tube was double ligated with 0- chromic suture and the intervening portion of tube was transected and removed with the Metzenbaum scissors. Excellent hemostasis was noted. Attention was then turned to the left fallopian tube after confirmation of identification by tracing the tube out to the fimbriae. The same procedure was then performed on the left fallopian tube. Again, excellent hemostasis was noted at the end of the procedure at both sites of the tubal ligation.   The uterus was returned to the abdomen. The pelvis was irrigated and again, excellent hemostasis was noted. The peritoneum was closed with a running stitch of 2-0 Vicryl. The On Q Pain pump System was then placed.  Trocars were placed through the abdominal wall into the subfascial space and these were used to thread the silver soaker cathaters into place. The rectus muscles were inspected and were hemostatic. The rectus fascia was then reapproximated with running sutures of 0-vicryl, with careful placement not to incorporate the cathaters. Subcutaneous tissues are then irrigated with saline and hemostasis assured with the bovie. The subcutaneous fat was approximated with 3-0 plain and a running stitch.  The skin was closed with 4-0 monocryl suture in a subcuticular fashion followed by skin  adhesive. The cathaters are flushed each with 5 mL of Bupivicaine and stabilized into  place with dressing. Instrument, sponge, and needle counts were correct prior to the abdominal closure and at the conclusion of the case.  The patient tolerated the procedure well and was transferred to the recovery room in stable condition.  Dr. Tiburcio Pea assisted with retraction of tissue and application of fundal pressure.   Natale Milch MD [facility], [facility] 03/05/20 8:58 AM

## 2020-03-05 NOTE — Anesthesia Procedure Notes (Deleted)
Spinal

## 2020-03-05 NOTE — Interval H&P Note (Signed)
History and Physical Interval Note:  03/05/2020 7:15 AM  Deanna Zimmerman  has presented today for surgery, with the diagnosis of hx of prior cesarean section O34.219.  The various methods of treatment have been discussed with the patient and family. After consideration of risks, benefits and other options for treatment, the patient has consented to  Procedure(s): CESAREAN SECTION WITH BILATERAL TUBAL LIGATION as a surgical intervention.  The patient's history has been reviewed, patient examined, no change in status, stable for surgery.  I have reviewed the patient's chart and labs.  Questions were answered to the patient's satisfaction.     [name] R Melaney Tellefsen

## 2020-03-05 NOTE — Progress Notes (Signed)
Spoke with Jerene Pitch, MD about no CBC drawn in pre-op and order given for lab to draw CBC now. MD also updated on patient CBG and she states to give 2 units of novolog once and to recheck 2 hours after eating.

## 2020-03-05 NOTE — Progress Notes (Signed)
Piscitello, MDA and [place], CNM notified of CBC not drawn in pre op. No new orders given at this time.

## 2020-03-05 NOTE — Anesthesia Preprocedure Evaluation (Signed)
Anesthesia Evaluation  Patient identified by MRN, date of birth, ID band Patient awake    Reviewed: Allergy & Precautions, H&P , NPO status , Patient's Chart, lab work & pertinent test results  History of Anesthesia Complications Negative for: history of anesthetic complications  Airway Mallampati: III  TM Distance: >3 FB Neck ROM: full    Dental  (+) Chipped   Pulmonary neg pulmonary ROS, neg shortness of breath,    Pulmonary exam normal        Cardiovascular Exercise Tolerance: Good hypertension, (-) angina(-) Past MI and (-) DOE Normal cardiovascular exam     Neuro/Psych    GI/Hepatic negative GI ROS,   Endo/Other  diabetes, Type 2, Insulin Dependent  Renal/GU   negative genitourinary   Musculoskeletal   Abdominal   Peds  Hematology negative hematology ROS (+)   Anesthesia Other Findings Past Medical History: No date: Diabetes mellitus (HCC) No date: Diabetes mellitus without complication (HCC) 02/07/2017: Goiter No date: Hypertension  Past Surgical History: No date: CESAREAN SECTION No date: FRACTURE SURGERY  BMI    Body Mass Index: 31.05 kg/m      Reproductive/Obstetrics (+) Pregnancy                             Anesthesia Physical Anesthesia Plan  ASA: III  Anesthesia Plan: Spinal   Post-op Pain Management:    Induction:   PONV Risk Score and Plan:   Airway Management Planned: Natural Airway and Nasal Cannula  Additional Equipment:   Intra-op Plan:   Post-operative Plan:   Informed Consent: I have reviewed the patients History and Physical, chart, labs and discussed the procedure including the risks, benefits and alternatives for the proposed anesthesia with the patient or authorized representative who has indicated his/her understanding and acceptance.     Dental Advisory Given  Plan Discussed with: Anesthesiologist, CRNA and Surgeon  Anesthesia Plan  Comments: (Patient reports no bleeding problems and no anticoagulant use.  Plan for spinal with backup GA  Patient consented for risks of anesthesia including but not limited to:  - adverse reactions to medications - damage to eyes, teeth, lips or other oral mucosa - nerve damage due to positioning  - risk of bleeding, infection, nerve damage and headache - risk of failed spinal - damage to teeth, lips or other oral mucosa - sore throat or hoarseness - damage to heart, brain, nerves, lungs or loss of life  Patient voiced understanding.)        Anesthesia Quick Evaluation

## 2020-03-06 ENCOUNTER — Encounter: Payer: Medicaid Other | Admitting: Obstetrics and Gynecology

## 2020-03-06 LAB — GLUCOSE, CAPILLARY
Glucose-Capillary: 96 mg/dL (ref 70–99)
Glucose-Capillary: 101 mg/dL — ABNORMAL HIGH (ref 70–99)
Glucose-Capillary: 61 mg/dL — ABNORMAL LOW (ref 70–99)
Glucose-Capillary: 87 mg/dL (ref 70–99)
Glucose-Capillary: 108 mg/dL — ABNORMAL HIGH (ref 70–99)
Glucose-Capillary: 139 mg/dL — ABNORMAL HIGH (ref 70–99)
Glucose-Capillary: 109 mg/dL — ABNORMAL HIGH (ref 70–99)

## 2020-03-06 LAB — CBC
HCT: 25.7 % — ABNORMAL LOW (ref 36.0–46.0)
Hemoglobin: 8 g/dL — ABNORMAL LOW (ref 12.0–15.0)
MCH: 27.7 pg (ref 26.0–34.0)
MCHC: 31.1 g/dL (ref 30.0–36.0)
MCV: 88.9 fL (ref 80.0–100.0)
Platelets: 266 10*3/uL (ref 150–400)
RBC: 2.89 MIL/uL — ABNORMAL LOW (ref 3.87–5.11)
RDW: 13.5 % (ref 11.5–15.5)
WBC: 14 10*3/uL — ABNORMAL HIGH (ref 4.0–10.5)
nRBC: 0 % (ref 0.0–0.2)

## 2020-03-06 MED ORDER — LABETALOL HCL 100 MG PO TABS
100.0000 mg | ORAL_TABLET | Freq: Two times a day (BID) | ORAL | Status: DC
  Administered 2020-03-06 – 2020-03-07 (×3): 100 mg via ORAL
  Filled 2020-03-06 (×3): qty 1

## 2020-03-06 NOTE — Progress Notes (Signed)
OB Progress Note    CBGs today have been normal: 61, 101, 87 (2hr pp), and 108 (2hr pp). The insulin that was ordered to be given with meals was held at breakfast and lunch and will be discontinued.  Will continue to monitor CBGs qid (fasting and 2 hours pp)  Farrel Conners, CNM

## 2020-03-06 NOTE — Progress Notes (Signed)
Hypoglycemic Event  CBG: 61  Treatment: orange juice  Symptoms: none  Follow-up CBG: Time: 0827 CBG Result: 101  Possible Reasons for Event: postpartum     Aksel Bencomo M Robyne Matar

## 2020-03-06 NOTE — Anesthesia Postprocedure Evaluation (Signed)
Anesthesia Post Note  Patient: Deanna Zimmerman  Procedure(s) Performed: CESAREAN SECTION WITH BILATERAL TUBAL LIGATION  Patient location during evaluation: Nursing Unit Anesthesia Type: Spinal Level of consciousness: awake, awake and alert and oriented Pain management: pain level controlled Vital Signs Assessment: post-procedure vital signs reviewed and stable Respiratory status: spontaneous breathing, nonlabored ventilation and respiratory function stable Cardiovascular status: blood pressure returned to baseline and stable Postop Assessment: no headache and no backache Anesthetic complications: no     Last Vitals:  Vitals:   03/06/20 0534 03/06/20 0746  BP: 105/65 99/63  Pulse: 95 84  Resp: 18 20  Temp: (!) 36.3 C 36.7 C  SpO2: 100% 98%    Last Pain:  Vitals:   03/06/20 0800  TempSrc:   PainSc: 0-No pain                 [employer]

## 2020-03-06 NOTE — Progress Notes (Signed)
POD #1 Repeat CS/ BTL. T2DM on insulin and Metformin and CHTN on labetalol 200 mgm BID Subjective:   Having a frontal headache this AM, but not lightheaded. Tolerating regular diet and passing flatus. Voided since foley removed. Pain managed with analgesics and On Q. Baby bottle feeding well.   Nurse reports that fasting blood sugar before breakfast was 61. Came up to 101 after OJ and has just finished eating breakfast. 4U insulin was held with breakfast, but did receive Metformin. Had 10 U Lantus last night at hs.  Objective:  Blood pressure 99/63, pulse 84, temperature 98.1 F (36.7 C), temperature source Oral, resp. rate 20, height 5' (1.524 m), weight 72.1 kg, last menstrual period 06/18/2019, SpO2 98 %, unknown if currently breastfeeding. Urine output 3500 ml General: BF in NAD Pulmonary: no increased work of breathing/ CTAB Heart: RRR without murmur Abdomen: non-distended, non-tender, fundus firm at level of umbilicus to U+1, NABS Incision: Honeycomb dressing C&D&I Extremities: no edema, no erythema, no tenderness  Results for orders placed or performed during the hospital encounter of 03/05/20 (from the past 72 hour(s))  Glucose, capillary     Status: Abnormal   Collection Time: 03/05/20  5:30 AM  Result Value Ref Range   Glucose-Capillary 126 (H) 70 - 99 mg/dL    Comment: Glucose reference range applies only to samples taken after fasting for at least 8 hours.  ABO/Rh     Status: None   Collection Time: 03/05/20  6:07 AM  Result Value Ref Range   ABO/RH(D)      O POS Performed at [facility], [address] [place], [place] [address]   Glucose, capillary     Status: Abnormal   Collection Time: 03/05/20  9:18 AM  Result Value Ref Range   Glucose-Capillary 141 (H) 70 - 99 mg/dL    Comment: Glucose reference range applies only to samples taken after fasting for at least 8 hours.  Glucose, capillary     Status: Abnormal   Collection Time: 03/05/20 10:40 AM  Result  Value Ref Range   Glucose-Capillary 152 (H) 70 - 99 mg/dL    Comment: Glucose reference range applies only to samples taken after fasting for at least 8 hours.  CBC     Status: Abnormal   Collection Time: 03/05/20 11:16 AM  Result Value Ref Range   WBC 12.0 (H) 4.0 - 10.5 K/uL   RBC 3.44 (L) 3.87 - 5.11 MIL/uL   Hemoglobin 9.5 (L) 12.0 - 15.0 g/dL   HCT 85.4 (L) 62.7 - 03.5 %   MCV 88.1 80.0 - 100.0 fL   MCH 27.6 26.0 - 34.0 pg   MCHC 31.4 30.0 - 36.0 g/dL   RDW 00.9 38.1 - 82.9 %   Platelets 292 150 - 400 K/uL   nRBC 0.0 0.0 - 0.2 %    Comment: Performed at [facility], [address] [place], [place] [address]  Glucose, capillary     Status: Abnormal   Collection Time: 03/05/20  1:53 PM  Result Value Ref Range   Glucose-Capillary 131 (H) 70 - 99 mg/dL    Comment: Glucose reference range applies only to samples taken after fasting for at least 8 hours.  Glucose, capillary     Status: Abnormal   Collection Time: 03/05/20  4:14 PM  Result Value Ref Range   Glucose-Capillary 197 (H) 70 - 99 mg/dL    Comment: Glucose reference range applies only to samples taken after fasting for at  least 8 hours.   Comment 1 Notify RN   Glucose, capillary     Status: None   Collection Time: 03/06/20  5:33 AM  Result Value Ref Range   Glucose-Capillary 96 70 - 99 mg/dL    Comment: Glucose reference range applies only to samples taken after fasting for at least 8 hours.   Comment 1 Notify RN    Comment 2 Document in Chart   CBC     Status: Abnormal   Collection Time: 03/06/20  6:24 AM  Result Value Ref Range   WBC 14.0 (H) 4.0 - 10.5 K/uL   RBC 2.89 (L) 3.87 - 5.11 MIL/uL   Hemoglobin 8.0 (L) 12.0 - 15.0 g/dL   HCT 25.7 (L) 36.0 - 46.0 %   MCV 88.9 80.0 - 100.0 fL   MCH 27.7 26.0 - 34.0 pg   MCHC 31.1 30.0 - 36.0 g/dL   RDW 13.5 11.5 - 15.5 %   Platelets 266 150 - 400 K/uL   nRBC 0.0 0.0 - 0.2 %    Comment: Performed at [facility], [address] [place],  [place] [address]  Glucose, capillary     Status: Abnormal   Collection Time: 03/06/20  8:00 AM  Result Value Ref Range   Glucose-Capillary 61 (L) 70 - 99 mg/dL    Comment: Glucose reference range applies only to samples taken after fasting for at least 8 hours.  Glucose, capillary     Status: Abnormal   Collection Time: 03/06/20  8:27 AM  Result Value Ref Range   Glucose-Capillary 101 (H) 70 - 99 mg/dL    Comment: Glucose reference range applies only to samples taken after fasting for at least 8 hours.     Assessment/ Plan:   34 y.o. [id number] postoperativeday # 1-stable  Continue postoperative and postpartum care  Ambulate with assist as needed.  Headache this AM: possibly from anemia T2DM: hypoglycemia this AM  Check 2hrpp BS after breakfast  Continue to monitor blood sugars, may be able to discontinue insulin with meals  Was on 26 U Lantus at hs and Metformin 1000 mgm BID prior to pregnancy CHTN: BP low this AM 99/63 on 200 labetalol BID  Decrease labetalol to 100 BID  Monitor blood pressures closely-may be able to discontinue labetalol  Was on HCTZ prior to pregnancy  Chronic anemia worsened by acute blood loss- hemodynamically stable and asymptomatic - po ferrous sulfate and vitamins  O POS/RI/VI   TDAP status -given 01/20/2020  Bottle/Contraception-BTL  Disposition on POD #2 or #3  [name], CNM

## 2020-03-06 NOTE — Anesthesia Post-op Follow-up Note (Signed)
  Anesthesia Pain Follow-up Note  Patient: Deanna Zimmerman  Day #: 1  Date of Follow-up: 03/06/2020 Time: 9:24 AM  Last Vitals:  Vitals:   03/06/20 0534 03/06/20 0746  BP: 105/65 99/63  Pulse: 95 84  Resp: 18 20  Temp: (!) 36.3 C 36.7 C  SpO2: 100% 98%    Level of Consciousness: alert  Pain: none   Side Effects:None  Catheter Site Exam:clean, dry, no drainage     Plan: D/C from anesthesia care at surgeon's request  [facility]

## 2020-03-07 LAB — GLUCOSE, CAPILLARY
Glucose-Capillary: 106 mg/dL — ABNORMAL HIGH (ref 70–99)
Glucose-Capillary: 50 mg/dL — ABNORMAL LOW (ref 70–99)

## 2020-03-07 MED ORDER — OXYCODONE-ACETAMINOPHEN 5-325 MG PO TABS: 1.0000 | ORAL_TABLET | ORAL | 0 refills | Status: DC | PRN

## 2020-03-07 MED ORDER — IBUPROFEN 800 MG PO TABS: 800.0000 mg | ORAL_TABLET | Freq: Three times a day (TID) | ORAL | 0 refills | Status: DC

## 2020-03-07 MED ORDER — METFORMIN HCL 1000 MG PO TABS: 1000.0000 mg | ORAL_TABLET | Freq: Two times a day (BID) | ORAL | 1 refills | Status: DC

## 2020-03-07 NOTE — Progress Notes (Signed)
Discharge instructions complete and prescriptions sent to the pharmacy by the provider. Patient verbalizes understanding of teaching. Patient discharged home via wheelchair at 1210.

## 2020-03-07 NOTE — Discharge Summary (Signed)
Postpartum Discharge Summary     Patient Name: Deanna Zimmerman DOB: 1986/07/08 MRN: [id number]  Date of admission: 03/05/2020 Delivery date:03/05/2020  Delivering provider: Adrian Prows R  Date of discharge: 03/07/2020  Admitting diagnosis: Previous cesarean section [Z98.891] Intrauterine pregnancy: [redacted]w[redacted]d    Secondary diagnosis:  Active Problems:   Type 2 diabetes mellitus affecting pregnancy in third trimester, antepartum   Supervision of high risk pregnancy in third trimester   Previous cesarean section   Admission for sterilization  Additional problems: none    Discharge diagnosis: Term Pregnancy Delivered                                              Post partum procedures:none Augmentation: N/A Complications: None  Hospital course: Sceduled C/S   34y.o. yo G3P3003 at 387w2das admitted to the hospital 03/05/2020 for scheduled cesarean section with the following indication:Elective Repeat.Delivery details are as follows:  Membrane Rupture Time/Date: 8:05 AM ,03/05/2020   Delivery Method:C-Section, Vacuum Assisted  Details of operation can be found in separate operative note.  Patient had an uncomplicated postpartum course.  She is ambulating, tolerating a regular diet, passing flatus, and urinating well. Patient is discharged home in stable condition on  03/07/20        Newborn Data: Birth date:03/05/2020  Birth time:8:07 AM  Gender:Female  Living status:Living  Apgars:9 ,9     Magnesium Sulfate received: No BMZ received: No Rhophylac:N/A MMR:No T-DaP:Given prenatally Flu: No Transfusion:No  Physical exam  Vitals:   03/06/20 2209 03/07/20 0040 03/07/20 0310 03/07/20 0741  BP: 115/76 116/75 107/69 134/84  Pulse: 100 97 95 99  Resp: _0 Temp:  98.2 F (36.8 C) 98.3 F (36.8 C) 98.4 F (36.9 C)  TempSrc:  Oral Oral Oral  SpO2: 100% 100% 100% 99%  Weight:      Height:       General: alert, cooperative and no distress Lochia:  appropriate Uterine Fundus: firm Incision: Healing well with no significant drainage DVT Evaluation: No evidence of DVT seen on physical exam. Labs: Lab Results  Component Value Date   WBC 14.0 (H) 03/06/2020   HGB 8.0 (L) 03/06/2020   HCT 25.7 (L) 03/06/2020   MCV 88.9 03/06/2020   PLT 266 03/06/2020   CMP Latest Ref Rng & Units 08/07/2019  Glucose 65 - 99 mg/dL 119(H)  BUN 6 - 20 mg/dL 9  Creatinine 0.57 - 1.00 mg/dL 0.56(L)  Sodium 134 - 144 mmol/L 137  Potassium 3.5 - 5.2 mmol/L 3.9  Chloride 96 - 106 mmol/L 102  CO2 20 - 29 mmol/L 22  Calcium 8.7 - 10.2 mg/dL 9.2  Total Protein 6.0 - 8.5 g/dL 6.9  Total Bilirubin 0.0 - 1.2 mg/dL 0.3  Alkaline Phos 39 - 117 IU/L 69  AST 0 - 40 IU/L 16  ALT 0 - 32 IU/L 10   Edinburgh Score: Edinburgh Postnatal Depression Scale Screening Tool 03/06/2020  I have been able to laugh and see the funny side of things. 0  I have looked forward with enjoyment to things. 0  I have blamed myself unnecessarily when things went wrong. 0  I have been anxious or worried for no good reason. 0  I have felt scared or panicky for no good reason. 0  Things have been getting on top of me.  0  I have been so unhappy that I have had difficulty sleeping. 0  I have felt sad or miserable. 0  I have been so unhappy that I have been crying. 0  The thought of harming myself has occurred to me. 0  Edinburgh Postnatal Depression Scale Total 0      After visit meds:  Allergies as of 03/07/2020   No Known Allergies     Medication List    STOP taking these medications   BD Veo Insulin Syringe U/F 31G X 15/64" 0.5 ML Misc Generic drug: Insulin Syringe-Needle U-100   [employer] Glucose Meter Test Strips test strip Generic drug: glucose blood   [employer] Lancets Misc   insulin lispro 100 UNIT/ML injection Commonly known as: HUMALOG   insulin lispro 100 UNIT/ML KwikPen Commonly known as: HUMALOG   Insulin Pen Needle 32G X 4 MM Misc   Lantus SoloStar 100 UNIT/ML  Solostar Pen Generic drug: insulin glargine     TAKE these medications   ibuprofen 800 MG tablet Commonly known as: ADVIL Take 1 tablet (800 mg total) by mouth every 8 (eight) hours.   labetalol 100 MG tablet Commonly known as: NORMODYNE Take 1 tablet (100 mg total) by mouth 2 (two) times daily.   metFORMIN 1000 MG tablet Commonly known as: GLUCOPHAGE Take 1 tablet (1,000 mg total) by mouth 2 (two) times daily with a meal.   oxyCODONE-acetaminophen 5-325 MG tablet Commonly known as: Percocet Take 1 tablet by mouth every 4 (four) hours as needed for severe pain.   PRENATAL VITAMIN PO Take 1 tablet by mouth daily.            Discharge Care Instructions  (From admission, onward)         Start     Ordered   03/07/20 0000  Discharge wound care:    Comments: You may apply a light dressing for minor discharge from the incision or to keep waistbands of clothing from rubbing.  You may also have been discharge with a clear dressing in which case this will be removed at your postoperative clinic visit.  You may shower, use soap on your incision.  Avoid baths or soaking the incision in the first 6 weeks following your surgery..   03/07/20 1011           Discharge home in stable condition Infant Feeding: Bottle Infant Disposition:home with mother Discharge instruction: per After Visit Summary and Postpartum booklet. Activity: Advance as tolerated. Pelvic rest for 6 weeks.  Diet: carb modified diet Anticipated Birth Control: BTL done PP Postpartum Appointment:1 week Additional Postpartum F/U: BP check 1 week Future Appointments:No future appointments. Follow up Visit: Follow-up Information    Schuman, Christanna R, MD. Schedule an appointment as soon as possible for a visit in 1 week.   Specialty: Obstetrics and Gynecology Why: For incision check Contact information: [address] [place] [address] [phone]               03/07/2020 Andreas  Staebler, MD  

## 2020-03-09 LAB — SURGICAL PATHOLOGY

## 2020-03-13 ENCOUNTER — Encounter: Payer: Self-pay | Admitting: Obstetrics and Gynecology

## 2020-03-13 ENCOUNTER — Other Ambulatory Visit: Payer: Self-pay

## 2020-03-13 ENCOUNTER — Ambulatory Visit (INDEPENDENT_AMBULATORY_CARE_PROVIDER_SITE_OTHER): Payer: Medicaid Other | Admitting: Obstetrics and Gynecology

## 2020-03-13 ENCOUNTER — Other Ambulatory Visit: Payer: Medicaid Other

## 2020-03-13 NOTE — Progress Notes (Signed)
  OBSTETRICS POSTPARTUM CLINIC PROGRESS NOTE  Subjective:     Deanna Zimmerman is a 34 y.o. G63P3003 female who presents for a postpartum visit. She is 1 week postpartum following a Term pregnancy and delivery by C-section repeat; no problems after deliver.  I have fully reviewed the prenatal and intrapartum course. Anesthesia: spinal.  Postpartum course has been complicated by uncomplicated.  Baby is feeding by Bottle.  Bleeding: patient has not  resumed menses.  Bowel function is normal. Bladder function is normal.  Patient is not sexually active. Contraception method desired is tubal ligation.  Postpartum depression screening: negative.  The following portions of the patient's history were reviewed and updated as appropriate: allergies, current medications, past family history, past medical history, past social history, past surgical history and problem list.  Review of Systems Pertinent items are noted in HPI.  Objective:    BP 126/82   Ht 5' (1.524 m)   Wt 142 lb (64.4 kg)   Breastfeeding No   BMI 27.73 kg/m   General:  alert and no distress   Breasts:  inspection negative, no nipple discharge or bleeding, no masses or nodularity palpable  Lungs: clear to auscultation bilaterally  Heart:  regular rate and rhythm, S1, S2 normal, no murmur, click, rub or gallop  Abdomen: soft, non-tender; bowel sounds normal; no masses,  no organomegaly.  Well healed Pfannenstiel incision                 Rectal Exam: Not performed.          Assessment:  Post Partum Care visit There are no diagnoses linked to this encounter.  Plan:  See orders and Patient Instructions Follow up in: 4 weeks or as needed.   Encouraged patient to monitor for blood glucose values greater than 180 after meals.  Reports fasting values have been 80-120.  She is taking metformin. She has discontinued insulin. Will follow up with PCP postpartum for diabetes management.  Last pap June 2020 per patient normal  with [facility].   Adelene Idler MD, [name], [facility] 03/13/2020 12:07 PM

## 2020-03-17 ENCOUNTER — Other Ambulatory Visit: Payer: Medicaid Other

## 2020-03-22 ENCOUNTER — Other Ambulatory Visit: Payer: Self-pay | Admitting: Obstetrics & Gynecology

## 2020-03-30 ENCOUNTER — Telehealth: Payer: Self-pay

## 2020-03-30 NOTE — Telephone Encounter (Signed)
Pt is calling stating [employer] has not received her paperwork.

## 2020-03-30 NOTE — Telephone Encounter (Signed)
Tried to call pt.. received confirmation for refaxed FMLA. She can come pick up copy if she would like. Or provide me with a different fax number. Please let me know if she calls again with this info or if she wants a copy

## 2020-03-31 NOTE — Telephone Encounter (Signed)
I spoke to the pt, she would like to come pick up a copy of the forms.

## 2020-04-13 ENCOUNTER — Ambulatory Visit (INDEPENDENT_AMBULATORY_CARE_PROVIDER_SITE_OTHER): Payer: Medicaid Other | Admitting: Obstetrics and Gynecology

## 2020-04-13 ENCOUNTER — Encounter: Payer: Self-pay | Admitting: Obstetrics and Gynecology

## 2020-04-13 ENCOUNTER — Other Ambulatory Visit: Payer: Self-pay

## 2020-04-13 DIAGNOSIS — E1165 Type 2 diabetes mellitus with hyperglycemia: Secondary | ICD-10-CM

## 2020-04-13 NOTE — Progress Notes (Signed)
  OBSTETRICS POSTPARTUM CLINIC PROGRESS NOTE  Subjective:     Deanna Zimmerman is a 34 y.o. G67P3003 female who presents for a postpartum visit. She is 6 weeks postpartum following a Term pregnancy and delivery by C-section repeat; no problems after deliver.  I have fully reviewed the prenatal and intrapartum course. Anesthesia: spinal.  Postpartum course has been complicated by uncomplicated.  Baby is feeding by Bottle.  Bleeding: patient has  resumed menses.  Bowel function is normal. Bladder function is normal.  Patient is sexually active. Contraception method desired is tubal ligation.  Postpartum depression screening: negative. Edinburgh 0.  The following portions of the patient's history were reviewed and updated as appropriate: allergies, current medications, past family history, past medical history, past social history, past surgical history and problem list.  Review of Systems Pertinent items are noted in HPI.  Objective:    BP 126/70   Pulse 91   Resp 16   Ht 5' (1.524 m)   Wt 135 lb 6.4 oz (61.4 kg)   SpO2 98%   Breastfeeding No   BMI 26.44 kg/m   General:  alert and no distress   Breasts:  inspection negative, no nipple discharge or bleeding, no masses or nodularity palpable  Lungs: clear to auscultation bilaterally  Heart:  regular rate and rhythm, S1, S2 normal, no murmur, click, rub or gallop  Abdomen: soft, non-tender; bowel sounds normal; no masses,  no organomegaly.   Well healed Pfannenstiel incision                            Assessment:  Post Partum Care visit 1. Postpartum care and examination normal  2. Type 2 diabetes mellitus with hyperglycemia, without long-term current use of insulin (HCC) Patient reports normal fasting (70s) and post prandial (120-130) blood glucose at home. She is taking metformin 1000 mg BID. She has plans to follow with her PCP after her medicaid expires in July.  Plan:  See orders and Patient Instructions Follow  up in: 12 months or as needed.   Adelene Idler MD, [name], [facility] 04/13/2020 11:11 AM

## 2020-04-17 ENCOUNTER — Other Ambulatory Visit: Payer: Self-pay | Admitting: Obstetrics & Gynecology

## 2020-05-20 ENCOUNTER — Other Ambulatory Visit: Payer: Self-pay | Admitting: Obstetrics & Gynecology

## 2020-06-13 ENCOUNTER — Other Ambulatory Visit: Payer: Self-pay | Admitting: Obstetrics & Gynecology

## 2020-07-07 ENCOUNTER — Other Ambulatory Visit: Payer: Self-pay | Admitting: Obstetrics & Gynecology

## 2020-08-01 ENCOUNTER — Other Ambulatory Visit: Payer: Self-pay | Admitting: Obstetrics & Gynecology

## 2021-04-15 ENCOUNTER — Ambulatory Visit: Payer: Medicaid Other | Admitting: Obstetrics and Gynecology

## 2021-12-08 IMAGING — US US MFM FETAL BPP W/ NON-STRESS
1 series · 13 of 18 positions shown · non-contrast
Comparison: none

PATIENT INFO:

PERFORMED BY:
                   Sonographer
                   BEVZ CNM
SERVICE(S) PROVIDED:
 ----------------------------------------------------------------------
INDICATIONS:
  33 weeks gestation of pregnancy
  Type 2 diabetes
  Chronic HTN
  BPP without NST
FETAL EVALUATION:
 Num Of Fetuses:         1
 Fetal Heart Rate(bpm):  147
 Cardiac Activity:       Present
 Presentation:           Vertex
 Placenta:               Anterior Grade 3, No previa
 Amniotic Fluid
 AFI FV:      Within normal limits
 AFI Sum(cm)     %Tile       Largest Pocket(cm)
 14.9            53
BIOPHYSICAL EVALUATION:
 Amniotic F.V:   Within normal limits       F. Tone:        Observed
 F. Movement:    Observed                   Score:          [DATE]
 F. Breathing:   Observed
GESTATIONAL AGE:
 LMP:           33w 6d        Date:  06/18/19                 EDD:   03/24/20
 Best:          33w 6d     Det. By:  LMP  (06/18/19)          EDD:   03/24/20

[Series 1: us mfm fetal bpp w/ non-stress · 18 acquisitions, 13 frames shown]
[im 1/18]
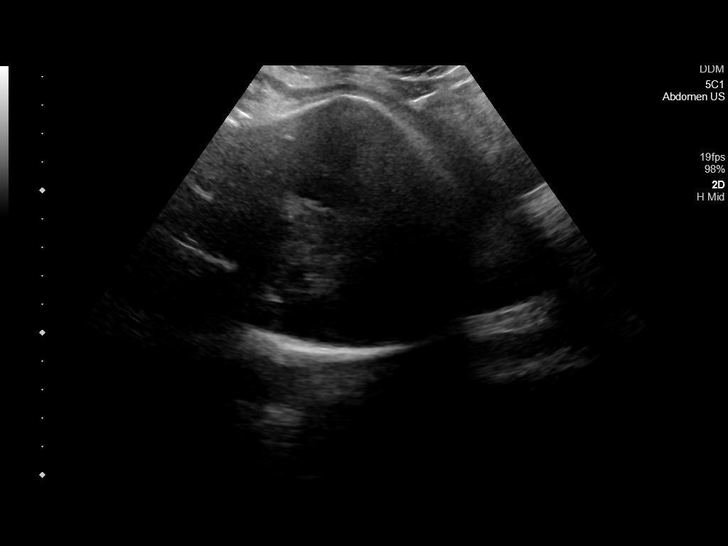
[im 3/18]
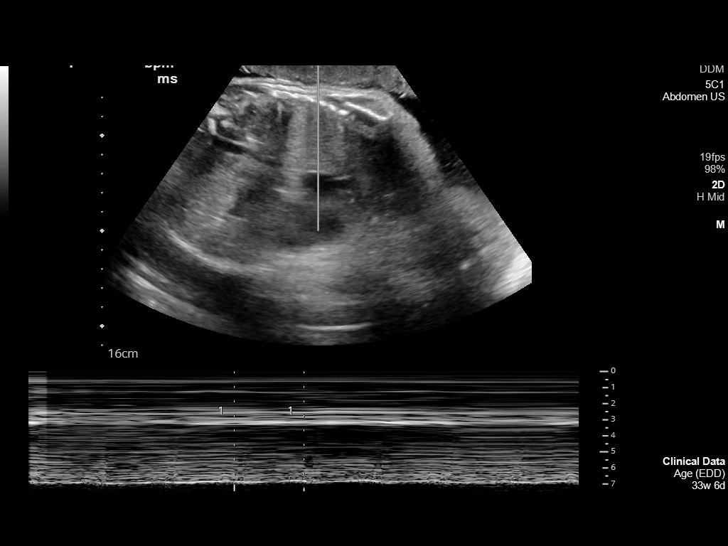
[im 4/18]
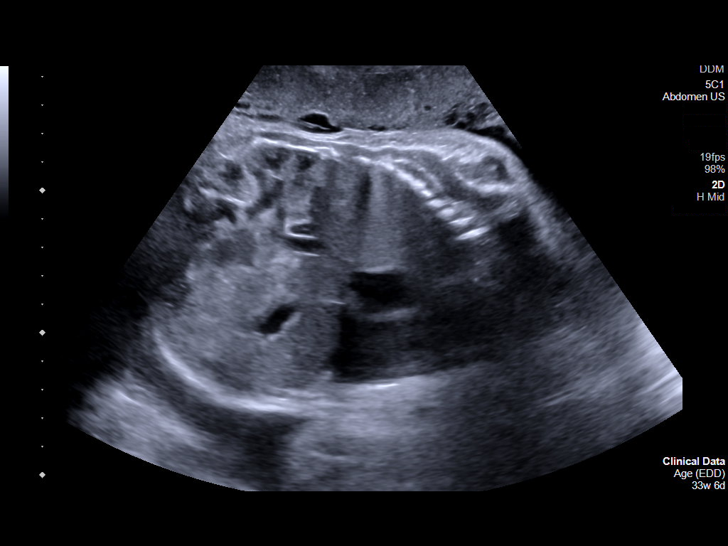
[im 5/18]
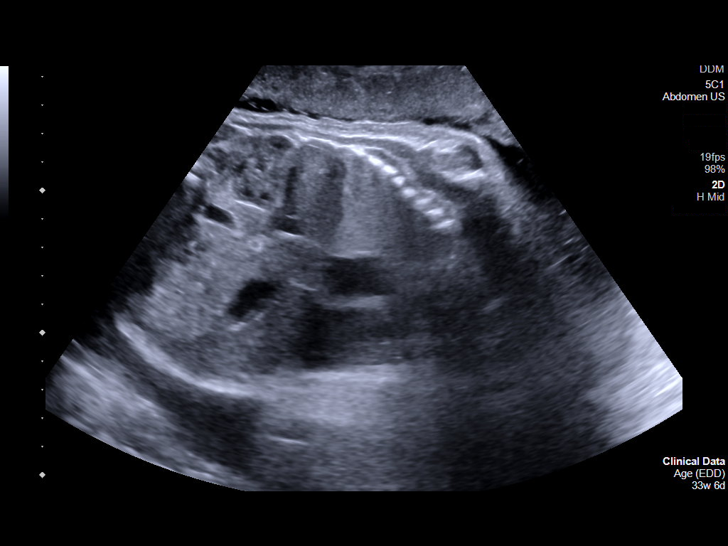
[im 7/18]
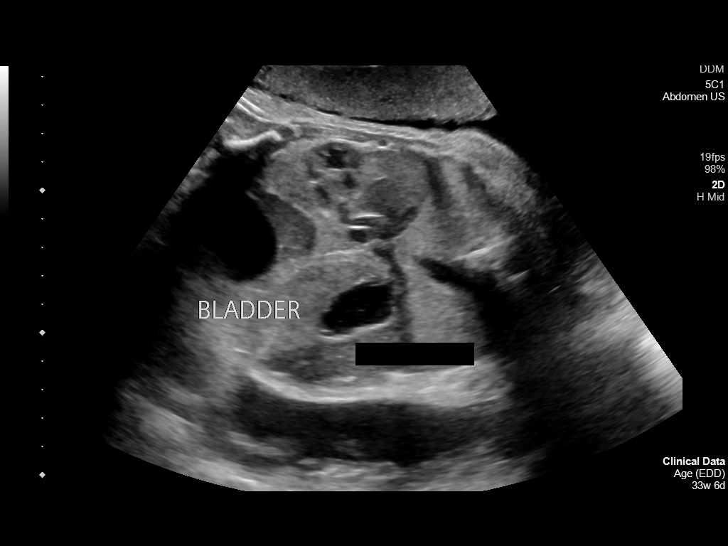
[im 8/18]
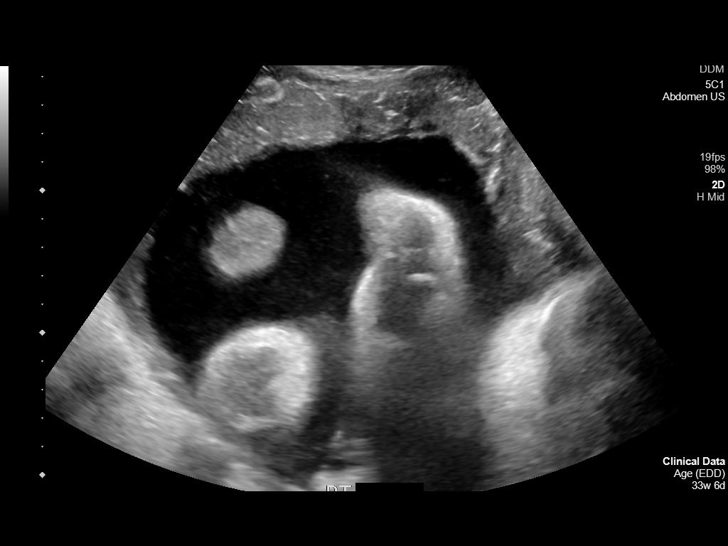
[im 10/18]
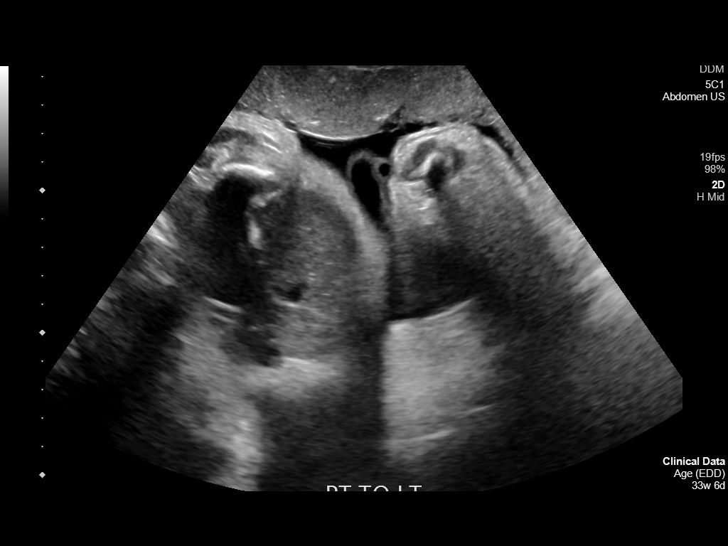
[im 11/18]
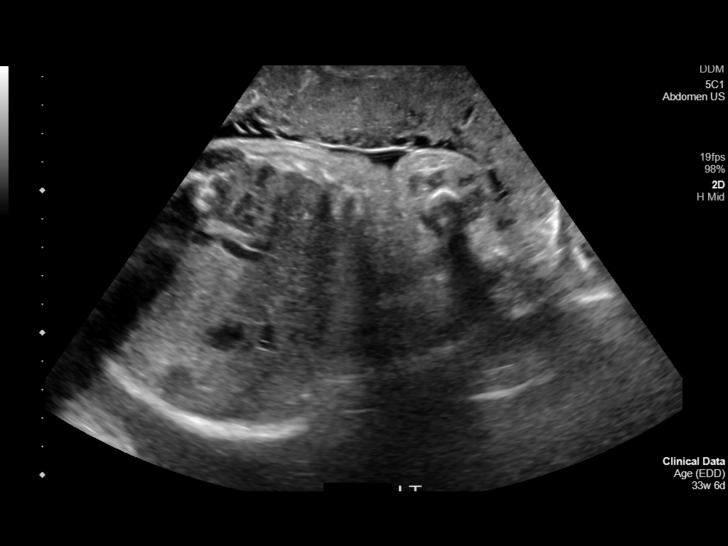
[im 12/18]
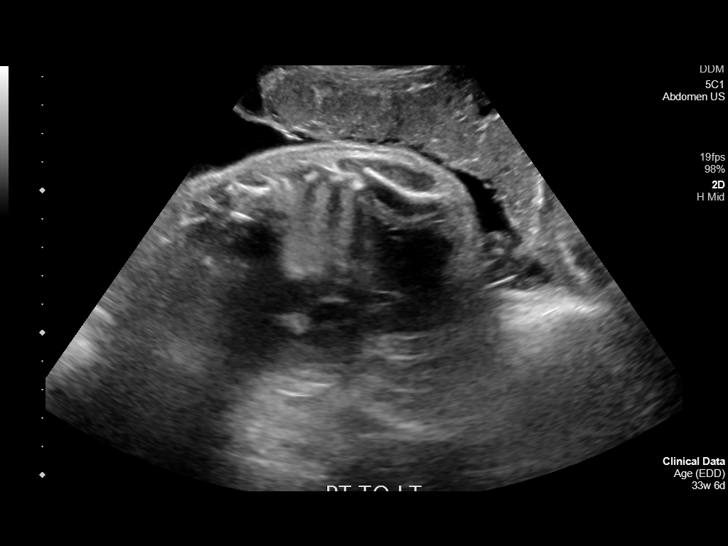
[im 14/18]
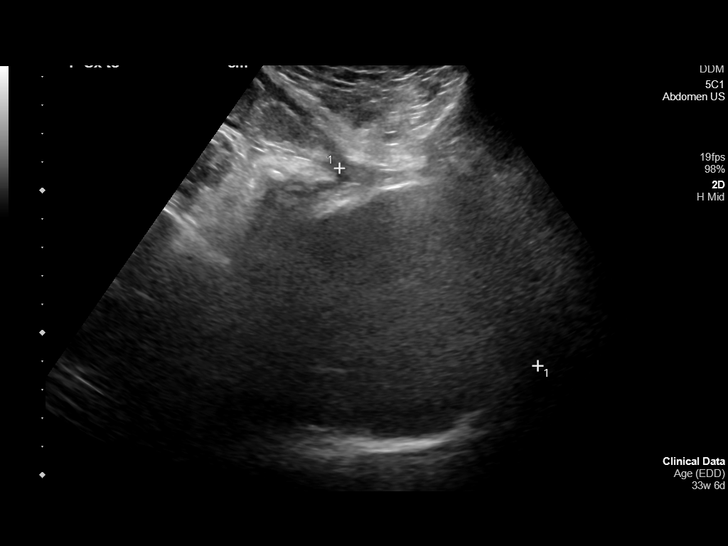
[im 15/18]
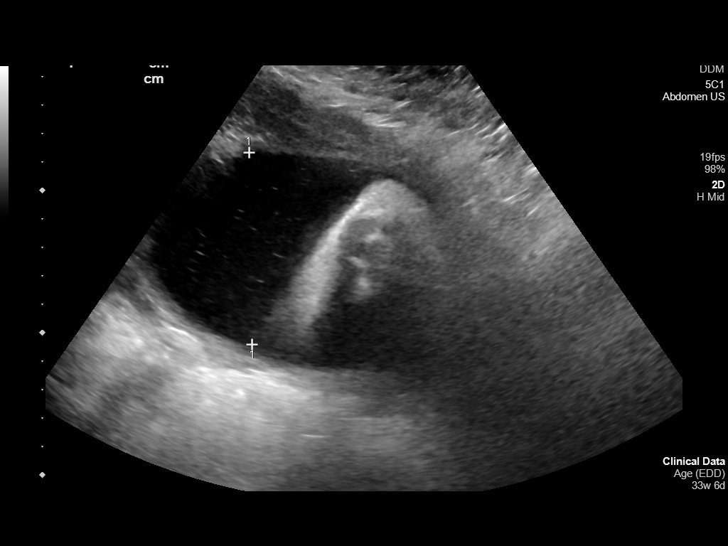
[im 16/18]
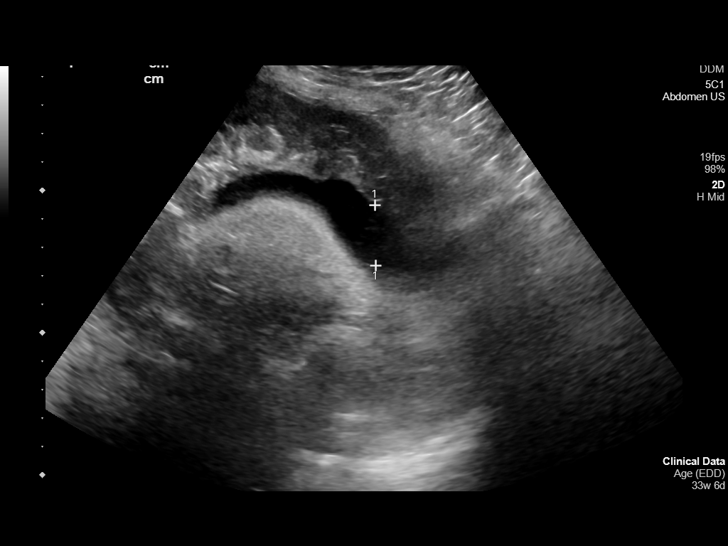
[im 18/18]
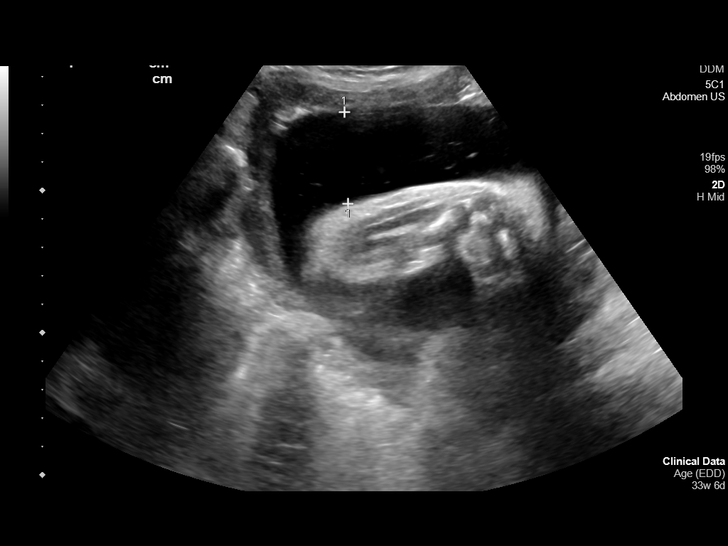

[13 of 18 positions shown; findings below may reference images not displayed]

IMPRESSION: Intrauterine pregnancy with a best estimated gestational age
 of 33 weeks 6 days.  Dating is based on LMP consistent with
 earliest available ultrasound performed at [facility]
 on 08/13/19; measurements were reported as 8 weeks 5
 days.

 Exam limited to BPP without NST.
 BPP [DATE].  Cephalic presentation.  Normal fluid.
                 Pharaoh, Hesham
